# Patient Record
Sex: Female | Born: 1965 | Race: White | Hispanic: No | Marital: Single | Smoking: Current every day smoker
Health system: Southern US, Community
[De-identification: ages and names within clinical notes are randomized; demographics above are authoritative.]

## PROBLEM LIST (undated history)

## (undated) DIAGNOSIS — Z789 Other specified health status: Secondary | ICD-10-CM

---

## 2002-11-07 ENCOUNTER — Other Ambulatory Visit: Admission: RE | Admit: 2002-11-07 | Discharge: 2002-11-07 | Payer: Self-pay | Admitting: *Deleted

## 2003-04-26 ENCOUNTER — Inpatient Hospital Stay (HOSPITAL_COMMUNITY): Admission: AD | Admit: 2003-04-26 | Discharge: 2003-04-26 | Payer: Self-pay | Admitting: Obstetrics & Gynecology

## 2003-05-15 ENCOUNTER — Inpatient Hospital Stay (HOSPITAL_COMMUNITY): Admission: AD | Admit: 2003-05-15 | Discharge: 2003-05-18 | Payer: Self-pay | Admitting: Obstetrics and Gynecology

## 2003-06-28 ENCOUNTER — Other Ambulatory Visit: Admission: RE | Admit: 2003-06-28 | Discharge: 2003-06-28 | Payer: Self-pay | Admitting: *Deleted

## 2004-04-27 ENCOUNTER — Emergency Department (HOSPITAL_COMMUNITY): Admission: EM | Admit: 2004-04-27 | Discharge: 2004-04-27 | Payer: Self-pay | Admitting: Emergency Medicine

## 2004-04-30 ENCOUNTER — Encounter: Admission: RE | Admit: 2004-04-30 | Discharge: 2004-04-30 | Payer: Self-pay | Admitting: Family Medicine

## 2004-05-02 ENCOUNTER — Emergency Department (HOSPITAL_COMMUNITY): Admission: EM | Admit: 2004-05-02 | Discharge: 2004-05-02 | Payer: Self-pay | Admitting: Emergency Medicine

## 2004-12-13 ENCOUNTER — Emergency Department (HOSPITAL_COMMUNITY): Admission: EM | Admit: 2004-12-13 | Discharge: 2004-12-13 | Payer: Self-pay | Admitting: Emergency Medicine

## 2005-03-03 ENCOUNTER — Ambulatory Visit (HOSPITAL_COMMUNITY): Admission: RE | Admit: 2005-03-03 | Discharge: 2005-03-03 | Payer: Self-pay | Admitting: Sports Medicine

## 2006-05-25 HISTORY — PX: CARPAL TUNNEL RELEASE: SHX101

## 2006-05-25 HISTORY — PX: CERVICAL DISC SURGERY: SHX588

## 2006-05-25 HISTORY — PX: CERVICAL FUSION: SHX112

## 2006-07-09 ENCOUNTER — Ambulatory Visit (HOSPITAL_COMMUNITY): Admission: RE | Admit: 2006-07-09 | Discharge: 2006-07-09 | Payer: Self-pay | Admitting: Neurosurgery

## 2006-09-16 ENCOUNTER — Encounter: Admission: RE | Admit: 2006-09-16 | Discharge: 2006-09-16 | Payer: Self-pay | Admitting: Neurosurgery
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
## 2006-10-18 ENCOUNTER — Emergency Department (HOSPITAL_COMMUNITY): Admission: EM | Admit: 2006-10-18 | Discharge: 2006-10-18 | Payer: Self-pay | Admitting: Emergency Medicine

## 2008-01-19 ENCOUNTER — Emergency Department (HOSPITAL_COMMUNITY): Admission: EM | Admit: 2008-01-19 | Discharge: 2008-01-19 | Payer: Self-pay | Admitting: Family Medicine

## 2010-03-18 ENCOUNTER — Emergency Department (HOSPITAL_COMMUNITY): Admission: EM | Admit: 2010-03-18 | Discharge: 2010-03-18 | Payer: Self-pay | Admitting: Emergency Medicine

## 2010-10-07 NOTE — Op Note (Signed)
NAME:  Charlotte Green, Charlotte Green NO.:  [id number]   MEDICAL RECORD NO.:  [id number]          PATIENT TYPE:  EMS   LOCATION:  MAJO                         FACILITY:  [facility]   PHYSICIAN:  Dionne Ano. Gramig III, M.D.DATE OF BIRTH:  08/11/1965   DATE OF PROCEDURE:  10/18/2006  DATE OF DISCHARGE:  10/18/2006                               OPERATIVE REPORT   PREOPERATIVE DIAGNOSIS:  Large volar laceration, right small finger.   POSTOPERATIVE DIAGNOSIS:  Large volar laceration, right small finger  with open P3 fracture and ulnar digital nerve laceration.   SURGICAL PROCEDURES:  1. Irrigation and debridement skin, subcutaneous tissue, bone, and      tendon tissue.  2. Exploration flexor digitorum profundus tendon which was noted to be      intact.  3. Open repair ulnar digital nerve with fine nylon suture.   SURGEON:  Dionne Ano. Amanda Pea, M.D.   ASSISTANT:  None.   COMPLICATIONS:  None.   INDICATIONS FOR PROCEDURE:  This patient is a 45 year old female who  presents for the above-mentioned reconstruction.  I have counseled her  in regards to the significant surgery and desires to proceed.   DESCRIPTION OF PROCEDURE:  She is taken to the procedure suite and  underwent thorough prep and drape with Betadine scrub and paint.  Following this, the patient underwent I&D skin, subcutaneous tissue,  bone, and tendinous tissue.   This was an excisional debridement with copious amounts of saline.   Following this,  the patient then underwent identification of the flexor  apparatus on the volar floor.  The patient had thorough exploration and  following exploration, underwent active range of motion intraoperatively  which revealed an intact tendon.   Following this, the bone was simply treated with I&D.  It did not need  any setting or reduction.   Following this, I identified the ulnar digital nerve and repaired both  ends with fine tip and nylon suture.  The patient tolerated this  well  and no complicating features.  Once this done, I then closed the wound  with combination 6-0 chromic and 4-0 chromic.   The patient then underwent placement of sterile dressing.   She tolerated procedure well.  I discussed with her elevation, splinting  and no gripping, twisting, pushing, pulling.  Have her return to see [place]  in 7 days in the office.  She is discharged on vitamin C one p.o. b.i.d.  500 mg tablet.  In addition to this, I have recommended Peri-Colace,  Vicodin for pain, and Keflex 500 mg one p.o. q.i.d. for 10 days.   It has been a pleasure to see her and participate in her postoperative  care.           ______________________________  Dionne Ano. Everlene Other, M.D.     [facility]ash Mantis  D:  10/18/2006  T:  10/19/2006  Job:  [id number]

## 2010-10-07 NOTE — H&P (Signed)
NAME:  Charlotte Green, ANTU NO.:  [id number]   MEDICAL RECORD NO.:  [id number]          PATIENT TYPE:  EMS   LOCATION:  MAJO                         FACILITY:  [facility]   PHYSICIAN:  Dionne Ano. Gramig III, M.D.DATE OF BIRTH:  [birth year]   DATE OF ADMISSION:  10/18/2006  DATE OF DISCHARGE:  10/18/2006                              HISTORY & PHYSICAL   I was asked to see the patient in the [facility] Emergency Room.  This  patient is a 45 year old, right-hand dominant female who sustained a  laceration to her right small finger as she was trying to cut up a  tomato.  She has a volar laceration, quite deep.  She complains of pain.  Tetanus status is updated.  The patient has no other injuries at the  present time.   PAST MEDICAL HISTORY:  None.   PAST SURGICAL HISTORY:  Carpal tunnel release and neck surgery by Dr.  Jordan Likes.   ALLERGIES:  CODEINE.   MEDICATIONS:  None.   SOCIAL HISTORY:  The patient's smokes less than a pack per day.  She  rarely enjoys an alcoholic beverage.  She works as a Hospital doctor.   PHYSICAL EXAMINATION:  GENERAL:  Alert and oriented x3, in no acute  distress.  VITAL SIGNS:  Stable.  EXTREMITIES:  The patient has full function about the index, middle, and  ring finger.  Her small finger has a very significant laceration,  triangular in shape, based distally unfortunately.  This goes down to  the flexor tendon apparatus.  I have reviewed this at length.  The  remaining exam is difficult due to her pain.  She does have some  numbness about the ulnar aspect of the pulp.  The opposite extremity is  neurovascularly intact.  Normal MCP range of motion.   X-rays were reviewed which showed a small chip fracture about the distal  phalanx.   IMPRESSION:  Open P3 fracture with significant disarray and ulnar  digital nerve laceration.   PLAN:  Verbally consented for I&D and repair of structures as necessary.     ______________________________  Dionne Ano. Everlene Other, M.D.     [facility]  D:  10/18/2006  T:  10/19/2006  Job:  [id number]   cc:   Markham Jordan L. Effie Shy, M.D.

## 2010-10-10 NOTE — Op Note (Signed)
NAME:  Charlotte Green, Charlotte Green                ACCOUNT NO.:  [id number]   MEDICAL RECORD NO.:  [id number]          PATIENT TYPE:  AMB   LOCATION:  SDS                          FACILITY:  [facility]   PHYSICIAN:  Henry A. Pool, M.D.    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   PROCEDURE NOTE:  C3-4 anterior cervical diskectomy with fusion allograft  and anterior plating.   SURGEON:  Kathaleen Maser. Pool, M.D.   ASSISTANT:  Donalee Citrin, M.D.   ANESTHESIA:  General.   INDICATIONS:  Charlotte Green is a 45 year old female with a history of chronic  severe neck pain with radiation to her left shoulder failing all  conservative management.  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  The patient has a leftward C3-4 disk herniation  associated with spondylosis.  She has failed all efforts at conservative  management.  She presents now for C3-4 anterior cervical diskectomy and  fusion with allograft and anterior plating in hopes of improving her  symptoms.  The patient has a coexistent left-sided carpal tunnel  syndrome, which will be addressed with a carpal tunnel release.  This be  dictated in a separate note.   OPERATIVE NOTE:  The patient was taken to the operating room and placed  on the operating table in the supine position.  After an adequate level  of anesthesia was achieved, the patient was positioned supine with the  neck slightly extended and held in place with halter traction.  The  patient's anterior cervical region was prepped and draped sterilely.  A  10 blade was used to make a linear skin incision overlying the C3-4  interspace.  This was carried down sharply to the  platysma.  The  platysma was then divided vertically and dissection along the medial  border of the sternomastoid muscle and carotid sheath.  Trachea and  esophagus were mobilized and retracted towards the left.  The  prevertebral fascia was stripped off the anterior spinal column.  The  longus colli muscle was elevated bilaterally using electrocautery.  Deep  self-retaining retractor was placed.  Intraoperative fluoroscopy was  used.  The C3-4 level was confirmed.  The disk space incised with 15  blade in a rectangular fashion.  A wide disk space clean-out was then  achieved using pituitary rongeurs, forward and backward-angled Karlin  curettes, Kerrison rongeurs, high-speed drill.  All elements of  the  disk were removed down to the level of the posterior annulus.  A  microscope was then brought onto the field and used throughout the  remainder of the diskectomy.  The remaining aspects of the annulus and  osteophytes were removed using the high-speed drill down to the level of  the posterior longitudinal ligament.  The posterior longitudinal  ligament was then elevated and  resected in piecemeal fashion using  Kerrison rongeurs.  The underlying thecal sac was identified.  A wide  central decompression then performed by undercutting the bodies of C3  and C4 using Kerrison rongeurs.  Anterior foraminotomy was then  performed along the course of the exiting C4 nerve roots bilaterally.  At this point a very thorough decompression had been achieved.  There  was no evidence of injury to the thecal sac or nerve roots.  The wound  was then irrigated with antibiotic solution.  Gelfoam was placed  topically for hemostasis.  A 5 mm LifeNet allograft wedge was then  packed into place and recessed proximal 1 mL from the anterior cortical  margin.  A 23 mL Atlantis anterior cervical plate was then placed over  the C3 and C4 bodies.  This was then attached under fluoroscopic  guidance using 13 mm  variable-angled screws, two each at both levels.  All screws were given final tightening and found to be solidly within  bone.  The locking screws were engaged at both levels.  Final images  revealed good position of bone grafts and hardware, proper operative  level, and normal alignment of the spine.  The wound was then inspected  for hemostasis, found to be good.  The wound was then closed in the  typical fashion.  Steri-Strips and sterile dressing were applied.  The  patient tolerates the procedure and she remains in the recovery room  awaiting her carpal tunnel release.           ______________________________  Kathaleen Maser. Pool, M.D.     HAP/MEDQ  D:  07/09/2006  T:  07/09/2006  Job:  [id number]

## 2010-10-10 NOTE — Op Note (Signed)
NAME:  Charlotte Green, PROBUS                ACCOUNT NO.:  [id number]   MEDICAL RECORD NO.:  [id number]          PATIENT TYPE:  AMB   LOCATION:  SDS                          FACILITY:  [facility]   PHYSICIAN:  Henry A. Pool, M.D.    DATE OF BIRTH:  10-20-1965   DATE OF PROCEDURE:  07/09/2006  DATE OF DISCHARGE:                               OPERATIVE REPORT   PREOPERATIVE DIAGNOSIS:  Left carpal tunnel syndrome.   POSTOPERATIVE DIAGNOSIS:  Left carpal tunnel syndrome.   PROCEDURE NOTE:  Left carpal tunnel release.   SURGEON:  Kathaleen Maser. Pool, M.D.   ASSISTANT:  None.   ANESTHESIA:  General orotracheal anesthesia.   INDICATIONS FOR PROCEDURE:  Ms. 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  She has failed conservative  management.  She is recently status post C3-4 anterior cervical  diskectomy and fusion and remains under general anesthesia from this  operation.  She is now here to undergo left-sided carpal tunnel release.   The patient's left upper extremity was prepped and draped sterilely.  A  15 blade was used make a linear incision along the mid palmar line just  distal to the distal wrist crease.  This carried down sharply to the  palmar fascia down to the level of the transverse carpal ligament.  Transverse carpal ligament then split using a 15 blade exposing the  underlying median nerve.  Median nerve was then protected using a Market researcher.  The transverse carpal ligament was then divided distally and  fully releasing the distal aspect of the nerve.  The Glorious Peach was then  placed proximally.  The transverse carpal ligament then divided into the  wrist and forearm fully decompressing the median nerve.  There is no  injury to the nerve itself.  There is no residual compression.  Wound is  then irrigated and then closed with a flow Vicryl suture in inverted  deep dermal layer and 4-0  nylon suture in an interrupted vertical  mattress fashion.  There were no apparent complications.  The patient  tolerated the procedure well and she returns to the recovery room  postoperatively.           ______________________________  Kathaleen Maser Pool, M.D.     HAP/MEDQ  D:  07/09/2006  T:  07/09/2006  Job:  [id number]

## 2010-11-07 ENCOUNTER — Emergency Department (HOSPITAL_COMMUNITY)
Admission: EM | Admit: 2010-11-07 | Discharge: 2010-11-07 | Disposition: A | Discharge status: Home / Self Care | Payer: BC Managed Care – PPO | Attending: Emergency Medicine | Admitting: Emergency Medicine

## 2010-11-07 ENCOUNTER — Emergency Department (HOSPITAL_COMMUNITY): Payer: BC Managed Care – PPO

## 2010-11-07 DIAGNOSIS — R079 Chest pain, unspecified: Secondary | ICD-10-CM | POA: Insufficient documentation

## 2010-11-07 DIAGNOSIS — M940 Chondrocostal junction syndrome [Tietze]: Secondary | ICD-10-CM | POA: Insufficient documentation

## 2010-11-07 DIAGNOSIS — H6690 Otitis media, unspecified, unspecified ear: Secondary | ICD-10-CM | POA: Insufficient documentation

## 2010-11-07 LAB — BASIC METABOLIC PANEL
BUN: 9 mg/dL (ref 6–23)
CO2: 24 mEq/L (ref 19–32)
Calcium: 9.4 mg/dL (ref 8.4–10.5)
Chloride: 106 mEq/L (ref 96–112)
Creatinine, Ser: 0.75 mg/dL (ref 0.50–1.10)
GFR calc Af Amer: >60 mL/min (ref >60)
GFR calc non Af Amer: >60 mL/min (ref >60)
Glucose, Bld: 94 mg/dL (ref 70–99)
Potassium: 4 mEq/L (ref 3.5–5.1)
Sodium: 138 mEq/L (ref 135–145)

## 2010-11-07 LAB — CBC
HCT: 39 % (ref 36.0–46.0)
Hemoglobin: 12.7 g/dL (ref 12.0–15.0)
MCH: 29.5 pg (ref 26.0–34.0)
MCHC: 32.6 g/dL (ref 30.0–36.0)
MCV: 90.5 fL (ref 78.0–100.0)
Platelets: 172 10*3/uL (ref 150–400)
RBC: 4.31 MIL/uL (ref 3.87–5.11)
RDW: 12.4 % (ref 11.5–15.5)
WBC: 8.7 10*3/uL (ref 4.0–10.5)

## 2010-11-07 LAB — DIFFERENTIAL
Basophils Absolute: 0.1 10*3/uL (ref 0.0–0.1)
Basophils Relative: 1 % (ref 0–1)
Eosinophils Absolute: 0.5 10*3/uL (ref 0.0–0.7)
Eosinophils Relative: 5 % (ref 0–5)
Lymphocytes Relative: 26 % (ref 12–46)
Lymphs Abs: 2.2 10*3/uL (ref 0.7–4.0)
Monocytes Absolute: 0.5 10*3/uL (ref 0.1–1.0)
Monocytes Relative: 6 % (ref 3–12)
Neutro Abs: 5.4 10*3/uL (ref 1.7–7.7)
Neutrophils Relative %: 63 % (ref 43–77)

## 2011-06-03 ENCOUNTER — Encounter (HOSPITAL_BASED_OUTPATIENT_CLINIC_OR_DEPARTMENT_OTHER): Payer: Self-pay | Admitting: *Deleted

## 2011-06-03 NOTE — Progress Notes (Signed)
To arrive with boyfriend No meds

## 2011-06-05 ENCOUNTER — Ambulatory Visit (HOSPITAL_BASED_OUTPATIENT_CLINIC_OR_DEPARTMENT_OTHER): Payer: BC Managed Care – PPO | Admitting: Anesthesiology

## 2011-06-05 ENCOUNTER — Encounter (HOSPITAL_BASED_OUTPATIENT_CLINIC_OR_DEPARTMENT_OTHER): Payer: Self-pay | Admitting: Anesthesiology

## 2011-06-05 ENCOUNTER — Ambulatory Visit (HOSPITAL_BASED_OUTPATIENT_CLINIC_OR_DEPARTMENT_OTHER)
Admission: RE | Admit: 2011-06-05 | Discharge: 2011-06-05 | Disposition: A | Discharge status: Home / Self Care | Payer: BC Managed Care – PPO | Source: Ambulatory Visit | Attending: Otolaryngology | Admitting: Otolaryngology

## 2011-06-05 ENCOUNTER — Encounter (HOSPITAL_BASED_OUTPATIENT_CLINIC_OR_DEPARTMENT_OTHER)
Admission: RE | Disposition: A | Discharge status: Home / Self Care | Payer: Self-pay | Source: Ambulatory Visit | Attending: Otolaryngology

## 2011-06-05 ENCOUNTER — Encounter (HOSPITAL_BASED_OUTPATIENT_CLINIC_OR_DEPARTMENT_OTHER): Payer: Self-pay | Admitting: *Deleted

## 2011-06-05 DIAGNOSIS — H7290 Unspecified perforation of tympanic membrane, unspecified ear: Secondary | ICD-10-CM | POA: Insufficient documentation

## 2011-06-05 DIAGNOSIS — H902 Conductive hearing loss, unspecified: Secondary | ICD-10-CM | POA: Insufficient documentation

## 2011-06-05 DIAGNOSIS — H7420 Discontinuity and dislocation of ear ossicles, unspecified ear: Secondary | ICD-10-CM | POA: Insufficient documentation

## 2011-06-05 HISTORY — PX: TYMPANOPLASTY: SHX33

## 2011-06-05 HISTORY — DX: Other specified health status: Z78.9

## 2011-06-05 LAB — POCT HEMOGLOBIN-HEMACUE: Hemoglobin: 13.2 g/dL (ref 12.0–15.0)

## 2011-06-05 SURGERY — TYMPANOPLASTY
Anesthesia: General | Site: Ear | Laterality: Left | Wound class: Clean Contaminated

## 2011-06-05 MED ORDER — LACTATED RINGERS IV SOLN
INTRAVENOUS | Status: DC
  Administered 2011-06-05 (×2): via INTRAVENOUS

## 2011-06-05 MED ORDER — PROPOFOL 10 MG/ML IV EMUL
INTRAVENOUS | Status: DC | PRN
  Administered 2011-06-05: 200 mg via INTRAVENOUS

## 2011-06-05 MED ORDER — MIDAZOLAM HCL 2 MG/2ML IJ SOLN: 0.5000 mg | INTRAMUSCULAR | Status: DC | PRN

## 2011-06-05 MED ORDER — SCOPOLAMINE 1 MG/3DAYS TD PT72
1.0000 | MEDICATED_PATCH | Freq: Once | TRANSDERMAL | Status: DC
  Administered 2011-06-05: 1.5 mg via TRANSDERMAL

## 2011-06-05 MED ORDER — HYDROCODONE-ACETAMINOPHEN 5-500 MG PO TABS: 1.0000 | ORAL_TABLET | ORAL | Status: AC | PRN

## 2011-06-05 MED ORDER — LIDOCAINE HCL (CARDIAC) 20 MG/ML IV SOLN
INTRAVENOUS | Status: DC | PRN
  Administered 2011-06-05: 100 mg via INTRAVENOUS

## 2011-06-05 MED ORDER — MIDAZOLAM HCL 5 MG/5ML IJ SOLN
INTRAMUSCULAR | Status: DC | PRN
  Administered 2011-06-05: 2 mg via INTRAVENOUS

## 2011-06-05 MED ORDER — DEXAMETHASONE SODIUM PHOSPHATE 4 MG/ML IJ SOLN
INTRAMUSCULAR | Status: DC | PRN
  Administered 2011-06-05: 10 mg via INTRAVENOUS

## 2011-06-05 MED ORDER — EPINEPHRINE HCL 1 MG/ML IJ SOLN
INTRAMUSCULAR | Status: DC | PRN
  Administered 2011-06-05: 1 mL

## 2011-06-05 MED ORDER — BACITRACIN ZINC 500 UNIT/GM EX OINT
TOPICAL_OINTMENT | CUTANEOUS | Status: DC | PRN
  Administered 2011-06-05: 1 via TOPICAL

## 2011-06-05 MED ORDER — CEFAZOLIN SODIUM 1-5 GM-% IV SOLN
INTRAVENOUS | Status: DC | PRN
  Administered 2011-06-05: 1 g via INTRAVENOUS

## 2011-06-05 MED ORDER — FENTANYL CITRATE 0.05 MG/ML IJ SOLN
25.0000 ug | INTRAMUSCULAR | Status: DC | PRN
  Administered 2011-06-05 (×2): 50 ug via INTRAVENOUS

## 2011-06-05 MED ORDER — SUCCINYLCHOLINE CHLORIDE 20 MG/ML IJ SOLN
INTRAMUSCULAR | Status: DC | PRN
  Administered 2011-06-05: 100 mg via INTRAVENOUS

## 2011-06-05 MED ORDER — CIPROFLOXACIN-DEXAMETHASONE 0.3-0.1 % OT SUSP
OTIC | Status: DC | PRN
  Administered 2011-06-05: 10 [drp] via OTIC

## 2011-06-05 MED ORDER — CEPHALEXIN 500 MG PO CAPS: 500.0000 mg | ORAL_CAPSULE | Freq: Two times a day (BID) | ORAL | Status: AC

## 2011-06-05 MED ORDER — FENTANYL CITRATE 0.05 MG/ML IJ SOLN
INTRAMUSCULAR | Status: DC | PRN
  Administered 2011-06-05 (×2): 50 ug via INTRAVENOUS

## 2011-06-05 MED ORDER — METOCLOPRAMIDE HCL 5 MG/ML IJ SOLN: 10.0000 mg | Freq: Once | INTRAMUSCULAR | Status: DC | PRN

## 2011-06-05 MED ORDER — ONDANSETRON HCL 4 MG/2ML IJ SOLN
INTRAMUSCULAR | Status: DC | PRN
  Administered 2011-06-05 (×2): 4 mg via INTRAVENOUS

## 2011-06-05 MED ORDER — ACETAMINOPHEN 10 MG/ML IV SOLN
1000.0000 mg | Freq: Once | INTRAVENOUS | Status: AC
  Administered 2011-06-05: 1000 mg via INTRAVENOUS

## 2011-06-05 MED ORDER — LIDOCAINE-EPINEPHRINE 1 %-1:100000 IJ SOLN
INTRAMUSCULAR | Status: DC | PRN
  Administered 2011-06-05: 5.5 mL

## 2011-06-05 MED ORDER — MORPHINE SULFATE 2 MG/ML IJ SOLN: 0.0500 mg/kg | INTRAMUSCULAR | Status: DC | PRN

## 2011-06-05 MED ORDER — HYDROCODONE-ACETAMINOPHEN 5-325 MG PO TABS
1.0000 | ORAL_TABLET | Freq: Once | ORAL | Status: AC
  Administered 2011-06-05: 1 via ORAL

## 2011-06-05 MED ORDER — METHYLENE BLUE 1 % INJ SOLN
INTRAMUSCULAR | Status: DC | PRN
  Administered 2011-06-05: .5 mL

## 2011-06-05 SURGICAL SUPPLY — 72 items
BENZOIN TINCTURE PRP APPL 2/3 (GAUZE/BANDAGES/DRESSINGS) ×2 IMPLANT
BIT DRILL LEGEND 0.5MM 70MM (BIT) IMPLANT
BIT DRILL LEGEND 1.0MM 70MM (BIT) IMPLANT
BIT DRILL LEGEND 4.0MM 70MM (BIT) IMPLANT
BLADE EAR TYMPAN 2.5 60D BEAV (BLADE) ×2 IMPLANT
BLADE EYE SICKLE 84 5 BEAV (BLADE) IMPLANT
BLADE NEEDLE 3 SS STRL (BLADE) IMPLANT
BLADE SURG ROTATE 9660 (MISCELLANEOUS) IMPLANT
CANISTER SUCTION 1200CC (MISCELLANEOUS) ×2 IMPLANT
CLOTH BEACON ORANGE TIMEOUT ST (SAFETY) ×2 IMPLANT
COTTONBALL LRG STERILE PKG (GAUZE/BANDAGES/DRESSINGS) ×2 IMPLANT
DECANTER SPIKE VIAL GLASS SM (MISCELLANEOUS) ×2 IMPLANT
DEPRESSOR TONGUE BLADE STERILE (MISCELLANEOUS) ×4 IMPLANT
DERMABOND ADVANCED (GAUZE/BANDAGES/DRESSINGS)
DERMABOND ADVANCED .7 DNX12 (GAUZE/BANDAGES/DRESSINGS) IMPLANT
DRAPE EENT ADH APERT 31X51 STR (DRAPES) ×2 IMPLANT
DRAPE MICROSCOPE URBAN (DRAPES) ×2 IMPLANT
DRAPE SURG 17X23 STRL (DRAPES) IMPLANT
DRESSING ADAPTIC 1/2  N-ADH (PACKING) IMPLANT
DRILL BIT LEGEND (BIT) IMPLANT
DRILL BIT LEGEND 7BA20-MN (BIT) IMPLANT
DRILL BIT LEGEND 7BA25-MN (BIT) IMPLANT
DRILL BIT LEGEND 7BA30-MN (BIT) IMPLANT
DRILL BIT LEGEND 7BA30D-MN (BIT) IMPLANT
DRILL BIT LEGEND 7BA30DL-MN (BIT) IMPLANT
DRILL BIT LEGEND 7BA30L-MN (BIT) IMPLANT
DRILL BIT LEGEND 7BA40-MN (BIT) IMPLANT
DRILL BIT LEGEND 7BA40D-MN (BIT) IMPLANT
DRILL BIT LEGEND 7BA50-MN (BIT) IMPLANT
DRILL BIT LEGEND 7BA50D-MN (BIT) IMPLANT
DRILL BIT LEGEND 7BA60-MN (BIT) IMPLANT
DRILL BIT LEGEND 7BA70-MN (BIT) IMPLANT
DRSG GLASSCOCK MASTOID ADT (GAUZE/BANDAGES/DRESSINGS) ×2 IMPLANT
DRSG GLASSCOCK MASTOID PED (GAUZE/BANDAGES/DRESSINGS) IMPLANT
ELECT COATED BLADE 2.86 ST (ELECTRODE) ×2 IMPLANT
ELECT REM PT RETURN 9FT ADLT (ELECTROSURGICAL) ×2
ELECTRODE REM PT RTRN 9FT ADLT (ELECTROSURGICAL) ×1 IMPLANT
GAUZE SPONGE 4X4 12PLY STRL LF (GAUZE/BANDAGES/DRESSINGS) IMPLANT
GLOVE SKINSENSE NS SZ7.0 (GLOVE) ×3
GLOVE SKINSENSE STRL SZ7.0 (GLOVE) ×3 IMPLANT
GLOVE SS BIOGEL STRL SZ 7.5 (GLOVE) ×1 IMPLANT
GLOVE SUPERSENSE BIOGEL SZ 7.5 (GLOVE) ×1
GOWN PREVENTION PLUS XLARGE (GOWN DISPOSABLE) ×2 IMPLANT
GOWN PREVENTION PLUS XXLARGE (GOWN DISPOSABLE) ×2 IMPLANT
IV CATH AUTO 14GX1.75 SAFE ORG (IV SOLUTION) IMPLANT
IV NS 500ML (IV SOLUTION)
IV NS 500ML BAXH (IV SOLUTION) IMPLANT
NDL SAFETY ECLIPSE 18X1.5 (NEEDLE) IMPLANT
NEEDLE 27GAX1X1/2 (NEEDLE) ×2 IMPLANT
NEEDLE HYPO 18GX1.5 SHARP (NEEDLE)
NS IRRIG 1000ML POUR BTL (IV SOLUTION) ×2 IMPLANT
PACK BASIN DAY SURGERY FS (CUSTOM PROCEDURE TRAY) ×2 IMPLANT
PACK ENT DAY SURGERY (CUSTOM PROCEDURE TRAY) ×2 IMPLANT
PENCIL BUTTON HOLSTER BLD 10FT (ELECTRODE) ×2 IMPLANT
SET EXT MALE ROTATING LL 32IN (MISCELLANEOUS) ×2 IMPLANT
SHEET MEDIUM DRAPE 40X70 STRL (DRAPES) IMPLANT
SLEEVE SCD COMPRESS KNEE MED (MISCELLANEOUS) ×2 IMPLANT
SPONGE GAUZE 4X4 12PLY (GAUZE/BANDAGES/DRESSINGS) IMPLANT
SPONGE SURGIFOAM ABS GEL 12-7 (HEMOSTASIS) ×4 IMPLANT
STRIP CLOSURE SKIN 1/2X4 (GAUZE/BANDAGES/DRESSINGS) IMPLANT
SUT CHROMIC 2 0 SH (SUTURE) IMPLANT
SUT CHROMIC 3 0 PS 2 (SUTURE) ×2 IMPLANT
SUT ETHILON 4 0 P 3 18 (SUTURE) IMPLANT
SUT ETHILON 5 0 P 3 18 (SUTURE) ×1
SUT NYLON ETHILON 5-0 P-3 1X18 (SUTURE) ×1 IMPLANT
SUT PLAIN 5 0 P 3 18 (SUTURE) ×2 IMPLANT
SYR 3ML 18GX1 1/2 (SYRINGE) IMPLANT
SYR TB 1ML LL NO SAFETY (SYRINGE) IMPLANT
TOWEL OR 17X24 6PK STRL BLUE (TOWEL DISPOSABLE) ×2 IMPLANT
TRAY DSU PREP LF (CUSTOM PROCEDURE TRAY) ×2 IMPLANT
TUBING IRRIGATION STER IRD100 (TUBING) IMPLANT
WATER STERILE IRR 1000ML POUR (IV SOLUTION) IMPLANT

## 2011-06-05 NOTE — Brief Op Note (Signed)
06/05/2011  11:25 AM  PATIENT:  Charlotte Green  46 y.o. female  PRE-OPERATIVE DIAGNOSIS:  tm perforation  POST-OPERATIVE DIAGNOSIS:  Left Tympanic Membrane Perforatio  w/ Hearing Loss  PROCEDURE:  Procedure(s): TYMPANOPLASTY with Cartilage Graft Ossiculoplasty  SURGEON:  Surgeon(s): Drema Halon, MD  PHYSICIAN ASSISTANT:   ASSISTANTS: none   ANESTHESIA:   general  EBL:  Total I/O In: 1800 [I.V.:1800] Out: -   BLOOD ADMINISTERED:none  DRAINS: none   LOCAL MEDICATIONS USED:  XYLOCAINE  With epi 10CC  SPECIMEN:  No Specimen  DISPOSITION OF SPECIMEN:  N/A  COUNTS:  YES  TOURNIQUET:  * No tourniquets in log *  DICTATION: .Other Dictation: Dictation Number [id number]  PLAN OF CARE: Discharge to home after PACU  PATIENT DISPOSITION:  PACU - hemodynamically stable.   Delay start of Pharmacological VTE agent (>24hrs) due to surgical blood loss or risk of bleeding:  {YES/NO/NOT APPLICABLE:20182

## 2011-06-05 NOTE — Transfer of Care (Signed)
Immediate Anesthesia Transfer of Care Note  Patient: Charlotte Green  Procedure(s) Performed:  TYMPANOPLASTY - with ossicular reconstruction w/ Cartilage Graft  Patient Location: PACU  Anesthesia Type: General  Level of Consciousness: awake, alert  and oriented  Airway & Oxygen Therapy: Patient Spontanous Breathing and Patient connected to nasal cannula oxygen  Post-op Assessment: Report given to PACU RN and Post -op Vital signs reviewed and stable  Post vital signs: Reviewed and stable Filed Vitals:   06/05/11 0623  BP: 105/74  Pulse: 71  Temp: 36.9 C  Resp: 16    Complications: No apparent anesthesia complications

## 2011-06-05 NOTE — Anesthesia Postprocedure Evaluation (Signed)
Anesthesia Post Note  Patient: Charlotte Green  Procedure(s) Performed:  TYMPANOPLASTY - with ossicular reconstruction w/ Cartilage Graft  Anesthesia type: General  Patient location: PACU  Post pain: Pain level controlled  Post assessment: Patient's Cardiovascular Status Stable  Last Vitals:  Filed Vitals:   06/05/11 1255  BP: 119/76  Pulse: 85  Temp: 36.9 C  Resp: 16    Post vital signs: Reviewed and stable  Level of consciousness: alert  Complications: No apparent anesthesia complications

## 2011-06-05 NOTE — Anesthesia Preprocedure Evaluation (Signed)
Anesthesia Evaluation  Patient identified by MRN, date of birth, ID band Patient awake    Reviewed: Allergy & Precautions, H&P , NPO status , Patient's Chart, lab work & pertinent test results, reviewed documented beta blocker date and time   Airway Mallampati: II TM Distance: >3 FB Neck ROM: full    Dental   Pulmonary neg pulmonary ROS,          Cardiovascular neg cardio ROS     Neuro/Psych Negative Neurological ROS  Negative Psych ROS   GI/Hepatic negative GI ROS, Neg liver ROS,   Endo/Other  Negative Endocrine ROS  Renal/GU negative Renal ROS  Genitourinary negative   Musculoskeletal   Abdominal   Peds  Hematology negative hematology ROS (+)   Anesthesia Other Findings See surgeon's H&P   Reproductive/Obstetrics negative OB ROS                           Anesthesia Physical Anesthesia Plan  ASA: II  Anesthesia Plan: General   Post-op Pain Management:    Induction: Intravenous  Airway Management Planned: Oral ETT  Additional Equipment:   Intra-op Plan:   Post-operative Plan: Extubation in OR  Informed Consent: I have reviewed the patients History and Physical, chart, labs and discussed the procedure including the risks, benefits and alternatives for the proposed anesthesia with the patient or authorized representative who has indicated his/her understanding and acceptance.     Plan Discussed with: CRNA and Surgeon  Anesthesia Plan Comments:         Anesthesia Quick Evaluation  

## 2011-06-05 NOTE — Anesthesia Procedure Notes (Signed)
Procedure Name: Intubation Date/Time: 06/05/2011 8:17 AM Performed by: Zenia Resides D Pre-anesthesia Checklist: Patient identified, Emergency Drugs available, Suction available and Patient being monitored Patient Re-evaluated:Patient Re-evaluated prior to inductionOxygen Delivery Method: Circle System Utilized Preoxygenation: Pre-oxygenation with 100% oxygen Intubation Type: IV induction Ventilation: Mask ventilation without difficulty Laryngoscope Size: Mac and 3 Grade View: Grade I Tube type: Oral Tube size: 7.0 mm Number of attempts: 1 Airway Equipment and Method: stylet and oral airway Placement Confirmation: ETT inserted through vocal cords under direct vision,  positive ETCO2 and breath sounds checked- equal and bilateral Secured at: 22 cm Tube secured with: Tape Dental Injury: Teeth and Oropharynx as per pre-operative assessment
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
## 2011-06-08 ENCOUNTER — Encounter (HOSPITAL_BASED_OUTPATIENT_CLINIC_OR_DEPARTMENT_OTHER): Payer: Self-pay | Admitting: Otolaryngology

## 2012-04-07 ENCOUNTER — Emergency Department (HOSPITAL_COMMUNITY): Payer: BC Managed Care – PPO

## 2012-04-07 ENCOUNTER — Emergency Department (HOSPITAL_COMMUNITY)
Admission: EM | Admit: 2012-04-07 | Discharge: 2012-04-07 | Disposition: A | Discharge status: Home / Self Care | Payer: BC Managed Care – PPO | Attending: Emergency Medicine | Admitting: Emergency Medicine

## 2012-04-07 DIAGNOSIS — Y9389 Activity, other specified: Secondary | ICD-10-CM | POA: Insufficient documentation

## 2012-04-07 DIAGNOSIS — Y929 Unspecified place or not applicable: Secondary | ICD-10-CM | POA: Insufficient documentation

## 2012-04-07 DIAGNOSIS — S161XXA Strain of muscle, fascia and tendon at neck level, initial encounter: Secondary | ICD-10-CM

## 2012-04-07 DIAGNOSIS — S139XXA Sprain of joints and ligaments of unspecified parts of neck, initial encounter: Secondary | ICD-10-CM | POA: Insufficient documentation

## 2012-04-07 DIAGNOSIS — F17200 Nicotine dependence, unspecified, uncomplicated: Secondary | ICD-10-CM | POA: Insufficient documentation

## 2012-04-07 MED ORDER — TRAMADOL HCL 50 MG PO TABS: 50.0000 mg | ORAL_TABLET | Freq: Four times a day (QID) | ORAL | Status: DC | PRN

## 2012-04-07 MED ORDER — IBUPROFEN 800 MG PO TABS
800.0000 mg | ORAL_TABLET | Freq: Once | ORAL | Status: AC
Start: 2012-04-07 — End: 2012-04-07
  Administered 2012-04-07: 800 mg via ORAL
  Filled 2012-04-07: qty 1

## 2012-04-07 NOTE — ED Notes (Addendum)
Dr. Rubin Payor currenlty assessing  pt.

## 2012-04-07 NOTE — ED Notes (Signed)
Patient transported to X-ray 
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
## 2013-08-22 ENCOUNTER — Encounter (HOSPITAL_COMMUNITY): Payer: Self-pay | Admitting: Emergency Medicine

## 2013-08-22 ENCOUNTER — Emergency Department (HOSPITAL_COMMUNITY)
Admission: EM | Admit: 2013-08-22 | Discharge: 2013-08-22 | Disposition: A | Discharge status: Home / Self Care | Payer: BC Managed Care – PPO | Attending: Emergency Medicine | Admitting: Emergency Medicine

## 2013-08-22 DIAGNOSIS — R42 Dizziness and giddiness: Secondary | ICD-10-CM | POA: Insufficient documentation

## 2013-08-22 DIAGNOSIS — H9209 Otalgia, unspecified ear: Secondary | ICD-10-CM | POA: Insufficient documentation

## 2013-08-22 DIAGNOSIS — J069 Acute upper respiratory infection, unspecified: Secondary | ICD-10-CM | POA: Insufficient documentation

## 2013-08-22 DIAGNOSIS — F17200 Nicotine dependence, unspecified, uncomplicated: Secondary | ICD-10-CM | POA: Insufficient documentation

## 2013-08-22 DIAGNOSIS — R52 Pain, unspecified: Secondary | ICD-10-CM | POA: Insufficient documentation

## 2013-08-22 DIAGNOSIS — B9789 Other viral agents as the cause of diseases classified elsewhere: Secondary | ICD-10-CM

## 2013-08-22 DIAGNOSIS — J988 Other specified respiratory disorders: Secondary | ICD-10-CM

## 2013-08-22 MED ORDER — HYDROCOD POLST-CHLORPHEN POLST 10-8 MG/5ML PO LQCR
5.0000 mL | Freq: Two times a day (BID) | ORAL | Status: DC | PRN
Start: 2013-08-22 — End: 2020-09-23

## 2013-08-22 MED ORDER — AMOXICILLIN 500 MG PO CAPS: 500.0000 mg | ORAL_CAPSULE | Freq: Three times a day (TID) | ORAL | Status: DC

## 2013-08-22 NOTE — Discharge Instructions (Signed)
Take ibuprofen w/ food up to three times a day, as needed for pain, fever and chills.  Take tussionex at night as needed for cough.  Do not drive within four hours of taking this medication (may cause drowsiness or confusion).   Take sudafed as needed for nasal/sinus congestion.  Get rest, drink plenty of fluids and wash hands often to prevent spread of infection.  You should return to the ER if your cough worsens, you develop difficulty breathing or pain in your chest or symptoms have not started to improve in 5-7days.

## 2013-08-22 NOTE — ED Notes (Signed)
Pt reports cold symptoms (sneezing, coughing, congestion with sore throat) for a week and is now having ear pain with pus and blood like drainage. Pt reports headaches and with some dizziness. Pt alert and orient and ambulatory to triage. Pt also reports heaviness to both legs.

## 2013-08-22 NOTE — ED Provider Notes (Signed)
Medical screening examination/treatment/procedure(s) were performed by non-physician practitioner and as supervising physician I was immediately available for consultation/collaboration.  Halena Mohar L Amiree No, MD 08/22/13 2313 
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
## 2013-12-31 ENCOUNTER — Emergency Department (INDEPENDENT_AMBULATORY_CARE_PROVIDER_SITE_OTHER): Payer: BC Managed Care – PPO

## 2013-12-31 ENCOUNTER — Emergency Department (INDEPENDENT_AMBULATORY_CARE_PROVIDER_SITE_OTHER)
Admission: EM | Admit: 2013-12-31 | Discharge: 2013-12-31 | Disposition: A | Discharge status: Home / Self Care | Payer: BC Managed Care – PPO | Source: Home / Self Care | Attending: Emergency Medicine | Admitting: Emergency Medicine

## 2013-12-31 ENCOUNTER — Encounter (HOSPITAL_COMMUNITY): Payer: Self-pay | Admitting: Emergency Medicine

## 2013-12-31 DIAGNOSIS — K2970 Gastritis, unspecified, without bleeding: Secondary | ICD-10-CM

## 2013-12-31 DIAGNOSIS — J189 Pneumonia, unspecified organism: Secondary | ICD-10-CM

## 2013-12-31 MED ORDER — BENZONATATE 200 MG PO CAPS: 200.0000 mg | ORAL_CAPSULE | Freq: Three times a day (TID) | ORAL | Status: DC | PRN

## 2013-12-31 MED ORDER — GI COCKTAIL ~~LOC~~
30.0000 mL | Freq: Once | ORAL | Status: AC
  Administered 2013-12-31: 30 mL via ORAL

## 2013-12-31 MED ORDER — CEFDINIR 300 MG PO CAPS: 300.0000 mg | ORAL_CAPSULE | Freq: Two times a day (BID) | ORAL | Status: DC

## 2013-12-31 MED ORDER — GI COCKTAIL ~~LOC~~
ORAL | Status: AC
  Filled 2013-12-31: qty 30

## 2013-12-31 MED ORDER — CEFTRIAXONE SODIUM 1 G IJ SOLR
1.0000 g | Freq: Once | INTRAMUSCULAR | Status: AC
  Administered 2013-12-31: 1 g via INTRAMUSCULAR

## 2013-12-31 MED ORDER — CEFTRIAXONE SODIUM 1 G IJ SOLR
INTRAMUSCULAR | Status: AC
  Filled 2013-12-31: qty 10

## 2013-12-31 MED ORDER — ALBUTEROL SULFATE HFA 108 (90 BASE) MCG/ACT IN AERS: 2.0000 | INHALATION_SPRAY | Freq: Four times a day (QID) | RESPIRATORY_TRACT | Status: DC

## 2013-12-31 MED ORDER — RANITIDINE HCL 150 MG PO TABS: 150.0000 mg | ORAL_TABLET | Freq: Two times a day (BID) | ORAL | Status: DC

## 2013-12-31 MED ORDER — LIDOCAINE HCL (PF) 1 % IJ SOLN
INTRAMUSCULAR | Status: AC
  Filled 2013-12-31: qty 5

## 2013-12-31 MED ORDER — AZITHROMYCIN 250 MG PO TABS: ORAL_TABLET | ORAL | Status: DC

## 2013-12-31 NOTE — ED Notes (Signed)
C/o productive cough for a week now States she has green yellowish mucous States household does have pneumonia Denies vomiting States she coughs some much her stomach aches

## 2013-12-31 NOTE — Discharge Instructions (Signed)

## 2013-12-31 NOTE — ED Provider Notes (Signed)
Chief Complaint   Chief Complaint  Patient presents with  . Cough    History of Present Illness   Charlotte Green is a 48 year old female who has had a one-week history of cough productive of yellow-green sputum with occasional blood, wheezing, tightness in the chest and chest pain. She smokes about half a pack of cigarettes per day. She's also had fever of up to 102, chills, and nasal congestion. She also has had a 3 to four-day history of epigastric abdominal pain which is worse when she coughs. This is been associated with nausea, a few loose stools, and some constipation. She is experienced headache, feels chilled, has extreme fatigue, and lack of appetite. She denies any sick exposures.  Review of Systems   Other than as noted above, the patient denies any of the following symptoms: Systemic:  No fevers, chills, sweats, or myalgias. Eye:  No redness or discharge. ENT:  No ear pain, headache, nasal congestion, drainage, sinus pressure, or sore throat. Neck:  No neck pain, stiffness, or swollen glands. Lungs:  No cough, sputum production, hemoptysis, wheezing, chest tightness, shortness of breath or chest pain. GI:  No abdominal pain, nausea, vomiting or diarrhea.  PMFSH   Past medical history, family history, social history, meds, and allergies were reviewed.   Physical exam   Vital signs:  BP 118/72  Pulse 84  Temp(Src) 99.3 F (37.4 C) (Oral)  Resp 20  SpO2 97%  LMP 02/06/2012 General:  Alert and oriented.  In no distress.  Skin warm and dry. Eye:  No conjunctival injection or drainage. Lids were normal. ENT:  TMs and canals were normal, without erythema or inflammation.  Nasal mucosa was clear and uncongested, without drainage.  Mucous membranes were moist.  Pharynx was clear with no exudate or drainage.  There were no oral ulcerations or lesions. Neck:  Supple, no adenopathy, tenderness or mass. Lungs:  No respiratory distress. She has some scattered wheezes in the  left midlung field posteriorly. No rales or rhonchi, good air movement bilaterally.  Heart:  Regular rhythm, without gallops, murmers or rubs. Abdomen: Soft and nondistended. There is slight epigastric tenderness to palpation without guarding or rebound. No organomegaly or mass. No pulsatile midline abdominal mass or bruit. Bowel sounds are normally active. Skin:  Clear, warm, and dry, without rash or lesions.  Radiology   Dg Chest 2 View  12/31/2013   CLINICAL DATA:  Productive cough and hemoptysis. History of chronic smoking.  EXAM: CHEST - 2 VIEW  COMPARISON:  11/07/2010  FINDINGS: Bronchial thickening and patchy infiltrates are identified in both lower lung zones. This may localize more to the region of the lingula and right middle lobe based on the lateral film. No edema, pleural fluid or pneumothorax. The heart size and mediastinal contours are within normal limits. No bony abnormalities.  IMPRESSION: Suspect bilateral pneumonia developing in the lingula and right middle lobe.   Electronically Signed   By: Irish Lack M.D.   On: 12/31/2013 11:26    Course in [facility]   Given Rocephin 1 g IM.  Assessment     The primary encounter diagnosis was Community acquired pneumonia. A diagnosis of Gastritis was also pertinent to this visit.  She does not have a primary care physician, so will need to see her here in 48 hours for recheck. I also urged her to return again in a month for a repeat chest x-ray. She was told that it was necessary to do this  to make sure that there was no evidence of tumor or cancer.  Plan    1.  Meds:  The following meds were prescribed:   Discharge Medication List as of 12/31/2013 11:41 AM    START taking these medications   Details  albuterol (PROVENTIL HFA;VENTOLIN HFA) 108 (90 BASE) MCG/ACT inhaler Inhale 2 puffs into the lungs 4 (four) times daily., Starting 12/31/2013, Until Discontinued, Normal    azithromycin (ZITHROMAX Z-PAK) 250 MG tablet Take  as directed., Normal    benzonatate (TESSALON) 200 MG capsule Take 1 capsule (200 mg total) by mouth 3 (three) times daily as needed for cough., Starting 12/31/2013, Until Discontinued, Normal    cefdinir (OMNICEF) 300 MG capsule Take 1 capsule (300 mg total) by mouth 2 (two) times daily., Starting 12/31/2013, Until Discontinued, Normal    ranitidine (ZANTAC) 150 MG tablet Take 1 tablet (150 mg total) by mouth 2 (two) times daily., Starting 12/31/2013, Until Discontinued, Normal        2.  Patient Education/Counseling:  The patient was given appropriate handouts, self care instructions, and instructed in symptomatic relief.  Instructed to get extra fluids, rest, and use a cool mist vaporizer.    3.  Follow up:  The patient was told to follow up here in 48 hours for a recheck, then again in 1 month for repeat chest x-ray, or sooner if becoming worse in any way, and given some red flag symptoms such as increasing fever, difficulty breathing, chest pain, or persistent vomiting which would prompt immediate return.       Reuben Likesavid C Markitta Ausburn, MD 12/31/13 [id number]

## 2014-05-02 ENCOUNTER — Emergency Department (HOSPITAL_COMMUNITY): Payer: BC Managed Care – PPO

## 2014-05-02 ENCOUNTER — Emergency Department (INDEPENDENT_AMBULATORY_CARE_PROVIDER_SITE_OTHER): Payer: BC Managed Care – PPO

## 2014-05-02 ENCOUNTER — Emergency Department (HOSPITAL_COMMUNITY)
Admission: EM | Admit: 2014-05-02 | Discharge: 2014-05-02 | Disposition: A | Discharge status: Home / Self Care | Payer: BC Managed Care – PPO | Attending: Emergency Medicine | Admitting: Emergency Medicine

## 2014-05-02 ENCOUNTER — Encounter (HOSPITAL_COMMUNITY): Payer: Self-pay | Admitting: Emergency Medicine

## 2014-05-02 ENCOUNTER — Emergency Department (HOSPITAL_COMMUNITY)
Admission: EM | Admit: 2014-05-02 | Discharge: 2014-05-02 | Disposition: A | Discharge status: Home / Self Care | Payer: BC Managed Care – PPO | Source: Home / Self Care | Attending: Emergency Medicine | Admitting: Emergency Medicine

## 2014-05-02 ENCOUNTER — Encounter (HOSPITAL_COMMUNITY): Payer: Self-pay | Admitting: *Deleted

## 2014-05-02 DIAGNOSIS — Y92092 Bedroom in other non-institutional residence as the place of occurrence of the external cause: Secondary | ICD-10-CM | POA: Diagnosis not present

## 2014-05-02 DIAGNOSIS — Z79899 Other long term (current) drug therapy: Secondary | ICD-10-CM | POA: Insufficient documentation

## 2014-05-02 DIAGNOSIS — W19XXXA Unspecified fall, initial encounter: Secondary | ICD-10-CM

## 2014-05-02 DIAGNOSIS — Z720 Tobacco use: Secondary | ICD-10-CM | POA: Insufficient documentation

## 2014-05-02 DIAGNOSIS — H9190 Unspecified hearing loss, unspecified ear: Secondary | ICD-10-CM | POA: Insufficient documentation

## 2014-05-02 DIAGNOSIS — Y998 Other external cause status: Secondary | ICD-10-CM | POA: Diagnosis not present

## 2014-05-02 DIAGNOSIS — R110 Nausea: Secondary | ICD-10-CM | POA: Insufficient documentation

## 2014-05-02 DIAGNOSIS — X58XXXA Exposure to other specified factors, initial encounter: Secondary | ICD-10-CM | POA: Diagnosis not present

## 2014-05-02 DIAGNOSIS — M79604 Pain in right leg: Secondary | ICD-10-CM

## 2014-05-02 DIAGNOSIS — M79601 Pain in right arm: Secondary | ICD-10-CM

## 2014-05-02 DIAGNOSIS — Y9339 Activity, other involving climbing, rappelling and jumping off: Secondary | ICD-10-CM | POA: Insufficient documentation

## 2014-05-02 DIAGNOSIS — M25561 Pain in right knee: Secondary | ICD-10-CM

## 2014-05-02 DIAGNOSIS — Z792 Long term (current) use of antibiotics: Secondary | ICD-10-CM | POA: Diagnosis not present

## 2014-05-02 DIAGNOSIS — S8991XA Unspecified injury of right lower leg, initial encounter: Secondary | ICD-10-CM | POA: Diagnosis not present

## 2014-05-02 LAB — I-STAT CHEM 8, ED
BUN: 17 mg/dL (ref 6–23)
Calcium, Ion: 1.17 mmol/L (ref 1.12–1.23)
Chloride: 103 mEq/L (ref 96–112)
Creatinine, Ser: 0.8 mg/dL (ref 0.50–1.10)
Glucose, Bld: 120 mg/dL — ABNORMAL HIGH (ref 70–99)
HCT: 48 % — ABNORMAL HIGH (ref 36.0–46.0)
Hemoglobin: 16.3 g/dL — ABNORMAL HIGH (ref 12.0–15.0)
Potassium: 3.7 mEq/L (ref 3.7–5.3)
Sodium: 140 mEq/L (ref 137–147)
TCO2: 21 mmol/L (ref 0–100)

## 2014-05-02 MED ORDER — OXYCODONE HCL 5 MG PO TABS: 5.0000 mg | ORAL_TABLET | ORAL | Status: DC | PRN

## 2014-05-02 MED ORDER — HYDROMORPHONE HCL 1 MG/ML IJ SOLN
INTRAMUSCULAR | Status: AC
  Filled 2014-05-02: qty 1

## 2014-05-02 MED ORDER — HYDROMORPHONE HCL 1 MG/ML IJ SOLN
1.0000 mg | Freq: Once | INTRAMUSCULAR | Status: AC
  Administered 2014-05-02: 1 mg via INTRAMUSCULAR

## 2014-05-02 MED ORDER — IOHEXOL 350 MG/ML SOLN
100.0000 mL | Freq: Once | INTRAVENOUS | Status: AC | PRN
  Administered 2014-05-02: 100 mL via INTRAVENOUS

## 2014-05-02 MED ORDER — MORPHINE SULFATE 4 MG/ML IJ SOLN
4.0000 mg | Freq: Once | INTRAMUSCULAR | Status: AC
  Administered 2014-05-02: 4 mg via INTRAVENOUS
  Filled 2014-05-02: qty 1

## 2014-05-02 NOTE — Discharge Instructions (Signed)
Read the information below.  Use the prescribed medication as directed.  Please discuss all new medications with your pharmacist.  You may return to the Emergency Department at any time for worsening condition or any new symptoms that concern you.  If there is any possibility that you might be pregnant, please let your health care provider know and discuss this with the pharmacist to ensure medication safety.  If you develop uncontrolled pain, weakness or numbness of the extremity, severe discoloration of the skin, or you are unable to move your foot or walk, return to the ER for a recheck.       Knee Pain The knee is the complex joint between your thigh and your lower leg. It is made up of bones, tendons, ligaments, and cartilage. The bones that make up the knee are:  The femur in the thigh.  The tibia and fibula in the lower leg.  The patella or kneecap riding in the groove on the lower femur. CAUSES  Knee pain is a common complaint with many causes. A few of these causes are:  Injury, such as:  A ruptured ligament or tendon injury.  Torn cartilage.  Medical conditions, such as:  Gout  Arthritis  Infections  Overuse, over training, or overdoing a physical activity. Knee pain can be minor or severe. Knee pain can accompany debilitating injury. Minor knee problems often respond well to self-care measures or get well on their own. More serious injuries may need medical intervention or even surgery. SYMPTOMS The knee is complex. Symptoms of knee problems can vary widely. Some of the problems are:  Pain with movement and weight bearing.  Swelling and tenderness.  Buckling of the knee.  Inability to straighten or extend your knee.  Your knee locks and you cannot straighten it.  Warmth and redness with pain and fever.  Deformity or dislocation of the kneecap. DIAGNOSIS  Determining what is wrong may be very straight forward such as when there is an injury. It can also be  challenging because of the complexity of the knee. Tests to make a diagnosis may include:  Your caregiver taking a history and doing a physical exam.  Routine X-rays can be used to rule out other problems. X-rays will not reveal a cartilage tear. Some injuries of the knee can be diagnosed by:  Arthroscopy a surgical technique by which a small video camera is inserted through tiny incisions on the sides of the knee. This procedure is used to examine and repair internal knee joint problems. Tiny instruments can be used during arthroscopy to repair the torn knee cartilage (meniscus).  Arthrography is a radiology technique. A contrast liquid is directly injected into the knee joint. Internal structures of the knee joint then become visible on X-ray film.  An MRI scan is a non X-ray radiology procedure in which magnetic fields and a computer produce two- or three-dimensional images of the inside of the knee. Cartilage tears are often visible using an MRI scanner. MRI scans have largely replaced arthrography in diagnosing cartilage tears of the knee.  Blood work.  Examination of the fluid that helps to lubricate the knee joint (synovial fluid). This is done by taking a sample out using a needle and a syringe. TREATMENT The treatment of knee problems depends on the cause. Some of these treatments are:  Depending on the injury, proper casting, splinting, surgery, or physical therapy care will be needed.  Give yourself adequate recovery time. Do not overuse your joints. If  you begin to get sore during workout routines, back off. Slow down or do fewer repetitions.  For repetitive activities such as cycling or running, maintain your strength and nutrition.  Alternate muscle groups. For example, if you are a weight lifter, work the upper body on one day and the lower body the next.  Either tight or weak muscles do not give the proper support for your knee. Tight or weak muscles do not absorb the stress  placed on the knee joint. Keep the muscles surrounding the knee strong.  Take care of mechanical problems.  If you have flat feet, orthotics or special shoes may help. See your caregiver if you need help.  Arch supports, sometimes with wedges on the inner or outer aspect of the heel, can help. These can shift pressure away from the side of the knee most bothered by osteoarthritis.  A brace called an "unloader" brace also may be used to help ease the pressure on the most arthritic side of the knee.  If your caregiver has prescribed crutches, braces, wraps or ice, use as directed. The acronym for this is PRICE. This means protection, rest, ice, compression, and elevation.  Nonsteroidal anti-inflammatory drugs (NSAIDs), can help relieve pain. But if taken immediately after an injury, they may actually increase swelling. Take NSAIDs with food in your stomach. Stop them if you develop stomach problems. Do not take these if you have a history of ulcers, stomach pain, or bleeding from the bowel. Do not take without your caregiver's approval if you have problems with fluid retention, heart failure, or kidney problems.  For ongoing knee problems, physical therapy may be helpful.  Glucosamine and chondroitin are over-the-counter dietary supplements. Both may help relieve the pain of osteoarthritis in the knee. These medicines are different from the usual anti-inflammatory drugs. Glucosamine may decrease the rate of cartilage destruction.  Injections of a corticosteroid drug into your knee joint may help reduce the symptoms of an arthritis flare-up. They may provide pain relief that lasts a few months. You may have to wait a few months between injections. The injections do have a small increased risk of infection, water retention, and elevated blood sugar levels.  Hyaluronic acid injected into damaged joints may ease pain and provide lubrication. These injections may work by reducing inflammation. A series  of shots may give relief for as long as 6 months.  Topical painkillers. Applying certain ointments to your skin may help relieve the pain and stiffness of osteoarthritis. Ask your pharmacist for suggestions. Many over the-counter products are approved for temporary relief of arthritis pain.  In some countries, doctors often prescribe topical NSAIDs for relief of chronic conditions such as arthritis and tendinitis. A review of treatment with NSAID creams found that they worked as well as oral medications but without the serious side effects. PREVENTION  Maintain a healthy weight. Extra pounds put more strain on your joints.  Get strong, stay limber. Weak muscles are a common cause of knee injuries. Stretching is important. Include flexibility exercises in your workouts.  Be smart about exercise. If you have osteoarthritis, chronic knee pain or recurring injuries, you may need to change the way you exercise. This does not mean you have to stop being active. If your knees ache after jogging or playing basketball, consider switching to swimming, water aerobics, or other low-impact activities, at least for a few days a week. Sometimes limiting high-impact activities will provide relief.  Make sure your shoes fit well. Choose footwear that  is right for your sport.  Protect your knees. Use the proper gear for knee-sensitive activities. Use kneepads when playing volleyball or laying carpet. Buckle your seat belt every time you drive. Most shattered kneecaps occur in car accidents.  Rest when you are tired. SEEK MEDICAL CARE IF:  You have knee pain that is continual and does not seem to be getting better.  SEEK IMMEDIATE MEDICAL CARE IF:  Your knee joint feels hot to the touch and you have a high fever. MAKE SURE YOU:   Understand these instructions.  Will watch your condition.  Will get help right away if you are not doing well or get worse. Document Released: 03/08/2007 Document Revised:  08/03/2011 Document Reviewed: 03/08/2007 [facility] Patient Information 2015 [employer] [place]. This information is not intended to replace advice given to you by your health care provider. Make sure you discuss any questions you have with your health care provider.

## 2014-05-02 NOTE — ED Provider Notes (Signed)
CSN: [id number]     Arrival date & time 05/02/14  1443 History  This chart was scribed for non-physician practitioner, Trixie DredgeEmily Stanlee Roehrig, PA-C, working with Arby BarretteMarcy Pfeiffer, MD by Charline BillsEssence Howell, ED Scribe. This patient was seen in room WTR8/WTR8 and the patient's care was started at 3:51 PM.   Chief Complaint  Patient presents with  . Leg Pain   The history is provided by the patient. No language interpreter was used.   HPI Comments: Delena BaliDarlene C Green is a 48 y.o. female, with no pertinent medical history, who presents to the Emergency Department complaining of sudden onset of constant R knee pain since yesterday. Pt was seen at Urgent Care earlier today for symptoms but referred to the ED. She reports that she jumped to get onto a high bed when her foot got caught on the edge of the bed. Pt reports that her knee twisted upon impact. Pt states that her knee felt "stuck" and was turned inward following the incident. She describes the pain as a sharp, aching sensation that radiates from her R buttocks to her R foot. Pt rates her pain 8/10. Pain is worse behind the R knee. Pain is improved with applying constant pressure. She reports associated numbness/tingling sensation in her R foot and nausea that she attributes to severity of pain. Pt denies fever, abdominal pain, chest pain, abdominal pain, urinary or bowel incontinence, any urinary symptoms. Pt has been treating with ice and elevation without relief.   Past Medical History  Diagnosis Date  . No pertinent past medical history   . Hearing loss     lt p hit in head 10/11   Past Surgical History  Procedure Laterality Date  . Cervical disc surgery  2008  . Carpal tunnel release  2008    lt  . Cervical fusion  2008  . Tympanoplasty  06/05/2011    Procedure: TYMPANOPLASTY;  Surgeon: Drema Halonhristopher E Newman, MD;  Location: [facility];  Service: ENT;  Laterality: Left;  with ossicular reconstruction w/ Cartilage Graft   No family history on  file. History  Substance Use Topics  . Smoking status: Current Every Day Smoker -- 0.50 packs/day  . Smokeless tobacco: Not on file  . Alcohol Use: Yes   OB History    No data available     Review of Systems  Constitutional: Negative for fever.  Cardiovascular: Negative for chest pain.  Gastrointestinal: Positive for nausea. Negative for abdominal pain.  Genitourinary: Negative.   Musculoskeletal: Positive for myalgias.  Neurological: Positive for numbness.  All other systems reviewed and are negative.  Allergies  Tylenol and Codeine  Home Medications   Prior to Admission medications   Medication Sig Start Date End Date Taking? Authorizing Provider  albuterol (PROVENTIL HFA;VENTOLIN HFA) 108 (90 BASE) MCG/ACT inhaler Inhale 2 puffs into the lungs 4 (four) times daily. 12/31/13   Reuben Likesavid C Keller, MD  amoxicillin (AMOXIL) 500 MG capsule Take 1 capsule (500 mg total) by mouth 3 (three) times daily. 08/22/13   Arie Sabinaatherine E Schinlever, PA-C  azithromycin (ZITHROMAX Z-PAK) 250 MG tablet Take as directed. 12/31/13   Reuben Likesavid C Keller, MD  benzonatate (TESSALON) 200 MG capsule Take 1 capsule (200 mg total) by mouth 3 (three) times daily as needed for cough. 12/31/13   Reuben Likesavid C Keller, MD  Camphor-Eucalyptus-Menthol [facility](VICKS VAPORUB EX) Apply 1 application topically as needed (congestion).    Historical Provider, MD  cefdinir (OMNICEF) 300 MG capsule Take 1 capsule (300 mg total) by  mouth 2 (two) times daily. 12/31/13   Reuben Likes, MD  chlorpheniramine-HYDROcodone [facility] PENNKINETIC ER) 10-8 MG/5ML LQCR Take 5 mLs by mouth every 12 (twelve) hours as needed for cough. 08/22/13   Arie Sabina Schinlever, PA-C  ibuprofen (ADVIL,MOTRIN) 200 MG tablet Take 600 mg by mouth every 6 (six) hours as needed for fever or moderate pain. For pain    Historical Provider, MD  naphazoline-glycerin (CLEAR EYES) 0.012-0.2 % SOLN Place 1-2 drops into both eyes every morning.    Historical Provider, MD   Pseudoeph-Doxylamine-DM-APAP (NYQUIL PO) Take 30 mLs by mouth at bedtime as needed (cold symptoms).    Historical Provider, MD  Pseudoephedrine-APAP-DM (DAYQUIL PO) Take 2 capsules by mouth every 6 (six) hours as needed (cold symptoms).    Historical Provider, MD  ranitidine (ZANTAC) 150 MG tablet Take 1 tablet (150 mg total) by mouth 2 (two) times daily. 12/31/13   Reuben Likes, MD   Triage Vitals: BP 118/56 mmHg  Pulse 89  Temp(Src) 98 F (36.7 C) (Oral)  Resp 20  SpO2 100%  LMP 02/06/2012 Physical Exam  Constitutional: She appears well-developed and well-nourished. No distress.  HENT:  Head: Normocephalic and atraumatic.  Neck: Neck supple.  Cardiovascular:  Palpable DP and PT pulses present and equal bilaterally.  Pulmonary/Chest: Effort normal.  Musculoskeletal:  Diffusely tender to posterior knee.No other tenderness. No erythema, edema, warmth, discharge, or tenderness. Active ROM with slight decreased to full extension and 90 flexion. No laxity of the joint but pain with valgus and varus stress. Altered sensation throughout R foot. Full ROM of R digits and ankle. Ankle and hip without pain. Spine nontender, no crepitus, or stepoffs.  Neurological: She is alert.  Skin: She is not diaphoretic.  Nursing note and vitals reviewed.  ED Course  Procedures (including critical care time) DIAGNOSTIC STUDIES: Oxygen Saturation is 100% on RA, normal by my interpretation.    COORDINATION OF CARE: 4:07 PM-Discussed treatment plan which includes CT, knee immobilizer, crutches and follow-up with orthopedist with pt at bedside and pt agreed to plan.   Labs Review Labs Reviewed  I-STAT CHEM 8, ED - Abnormal; Notable for the following:    Glucose, Bld 120 (*)    Hemoglobin 16.3 (*)    HCT 48.0 (*)    All other components within normal limits  POC URINE PREG, ED   Imaging Review Ct Angio Low Extrem Right W/cm &/or Wo/cm  05/02/2014   CLINICAL DATA:  Injury to the right leg. Unable to  bear weight and numbness in the right foot. Evaluate for arterial injury.  EXAM: CT ANGIOGRAPHY OF THE RIGHT LOWEREXTREMITY  TECHNIQUE: Multidetector CT imaging of the right lower extremitywas performed using the standard protocol during bolus administration of intravenous contrast. Multiplanar CT image reconstructions and MIPs were obtained to evaluate the vascular anatomy.  CONTRAST:  OMNIPAQUE IOHEXOL 350 MG/ML SOLN  COMPARISON:  Knee radiographs 05/02/2014  FINDINGS: Arterial structures: The abdominal aorta is patent without aneurysm. Mild atherosclerotic plaque in the abdominal aorta. The SMA, IMA and bilateral renal arteries are patent without significant stenosis. Incidentally, there are bilateral accessory inferior renal arteries. The iliac arteries are patent without significant stenosis or plaque.  The right common femoral artery, right SFA and the deep right femoral arteries are patent. No significant plaque and no focal stenosis in the right lower extremity arteries. Right popliteal artery is patent. There is 3 vessel runoff in the right calf. There is flow across the ankle from the  posterior tibial artery and probably the dorsalis pedis artery.  Review of the MIP images confirms the above findings.  Nonvascular:  No acute bone abnormality in the right lower extremity or visualized pelvic bones. The visualized intra-abdominal structures are unremarkable. There is no evidence for lymphadenopathy or free fluid. There is a small suprapatellar right knee joint effusion. The right patella is located.  IMPRESSION: Normal arterial flow in the right lower extremity. No evidence for stenosis, injury or occlusion.  Small right suprapatellar knee joint effusion. No acute bone abnormality.  Mild atherosclerotic disease in the distal abdominal aorta. Incidentally, the patient has accessory renal arteries.   Electronically Signed   By: Richarda OverlieAdam  Henn M.D.   On: 05/02/2014 18:10   Dg Knee Complete 4 Views  Right  05/02/2014   CLINICAL DATA:  Twisting injury yesterday. Knee pain with limited extension. Initial encounter.  EXAM: RIGHT KNEE - COMPLETE 4+ VIEW  COMPARISON:  None.  FINDINGS: The mineralization and alignment are normal. There is no evidence of acute fracture or dislocation. The joint spaces are preserved. There is no focal soft tissue swelling or knee joint effusion.  IMPRESSION: Negative right knee radiographs.   Electronically Signed   By: Roxy HorsemanBill  Veazey M.D.   On: 05/02/2014 12:38    EKG Interpretation None      MDM   Final diagnoses:  Knee pain, acute, right   Afebrile, nontoxic patient with right knee pain after twisting injury yesterday.  Unclear story with pt unsure about dislocation.  Considered low risk for this given the mechanism of injury but pt with severe pain, palpable but faint pulses in feet (though fairly equal bilaterally).  CT angio lower extremity negative for vascular injury.  Joint effusion present.   D/C home with knee immobilizer, crutches, pain medication, orthopedic follow up.   Discussed result, findings, treatment, and follow up  with patient.  Pt given return precautions.  Pt verbalizes understanding and agrees with plan.       I personally performed the services described in this documentation, which was scribed in my presence. The recorded information has been reviewed and is accurate.    Trixie Dredgemily Ambrie Carte, PA-C 05/02/14 1850  Arby BarretteMarcy Pfeiffer, MD 05/03/14 [phone]

## 2014-05-02 NOTE — ED Notes (Signed)
Pt was seen at urgent care earlier for symptoms. Pt states yesterday she injured leg getting off high bed. Pt state she is unable to bear weight on it. Pt has scans done at urgent care but sent over here for further testing.

## 2014-05-02 NOTE — ED Provider Notes (Signed)
CSN: [id number]     Arrival date & time 05/02/14  1104 History   None    Chief Complaint  Patient presents with  . Knee Injury   (Consider location/radiation/quality/duration/timing/severity/associated sxs/prior Treatment) HPI         48 year old female presents complaining of right knee pain. She was trying to jump onto a bed 2 days ago when she had a twisting injury and acute onset of pain in her right leg. This pain goes from her posterior thigh around her buttock down to her foot, worse in the back of the knee. It is constant, worse with any attempted ambulation. She cannot put any weight on her foot and has been completely nonambulatory since this happened. She also has subjective numbness in her foot and her knee feels swollen. She says the pain is severe, 10 out of 10 in severity. No previous history of knee injuries, back injuries, all his injuries.  Past Medical History  Diagnosis Date  . No pertinent past medical history   . Hearing loss     lt p hit in head 10/11   Past Surgical History  Procedure Laterality Date  . Cervical disc surgery  2008  . Carpal tunnel release  2008    lt  . Cervical fusion  2008  . Tympanoplasty  06/05/2011    Procedure: TYMPANOPLASTY;  Surgeon: Drema Halonhristopher E Newman, MD;  Location: [facility];  Service: ENT;  Laterality: Left;  with ossicular reconstruction w/ Cartilage Graft   History reviewed. No pertinent family history. History  Substance Use Topics  . Smoking status: Current Every Day Smoker -- 0.50 packs/day  . Smokeless tobacco: Not on file  . Alcohol Use: Yes   OB History    No data available     Review of Systems  Musculoskeletal: Negative for joint swelling.       Right leg and knee pain  All other systems reviewed and are negative.   Allergies  Tylenol and Codeine  Home Medications   Prior to Admission medications   Medication Sig Start Date End Date Taking? Authorizing Provider  albuterol (PROVENTIL  HFA;VENTOLIN HFA) 108 (90 BASE) MCG/ACT inhaler Inhale 2 puffs into the lungs 4 (four) times daily. 12/31/13   Reuben Likesavid C Keller, MD  amoxicillin (AMOXIL) 500 MG capsule Take 1 capsule (500 mg total) by mouth 3 (three) times daily. 08/22/13   Arie Sabinaatherine E Schinlever, PA-C  azithromycin (ZITHROMAX Z-PAK) 250 MG tablet Take as directed. 12/31/13   Reuben Likesavid C Keller, MD  benzonatate (TESSALON) 200 MG capsule Take 1 capsule (200 mg total) by mouth 3 (three) times daily as needed for cough. 12/31/13   Reuben Likesavid C Keller, MD  Camphor-Eucalyptus-Menthol [facility](VICKS VAPORUB EX) Apply 1 application topically as needed (congestion).    Historical Provider, MD  cefdinir (OMNICEF) 300 MG capsule Take 1 capsule (300 mg total) by mouth 2 (two) times daily. 12/31/13   Reuben Likesavid C Keller, MD  chlorpheniramine-HYDROcodone [facility](TUSSIONEX PENNKINETIC ER) 10-8 MG/5ML LQCR Take 5 mLs by mouth every 12 (twelve) hours as needed for cough. 08/22/13   Arie Sabinaatherine E Schinlever, PA-C  ibuprofen (ADVIL,MOTRIN) 200 MG tablet Take 600 mg by mouth every 6 (six) hours as needed for fever or moderate pain. For pain    Historical Provider, MD  naphazoline-glycerin (CLEAR EYES) 0.012-0.2 % SOLN Place 1-2 drops into both eyes every morning.    Historical Provider, MD  Pseudoeph-Doxylamine-DM-APAP (NYQUIL PO) Take 30 mLs by mouth at bedtime as needed (cold symptoms).  Historical Provider, MD  Pseudoephedrine-APAP-DM (DAYQUIL PO) Take 2 capsules by mouth every 6 (six) hours as needed (cold symptoms).    Historical Provider, MD  ranitidine (ZANTAC) 150 MG tablet Take 1 tablet (150 mg total) by mouth 2 (two) times daily. 12/31/13   Reuben Likesavid C Keller, MD   BP 120/80 mmHg  Pulse 100  Temp(Src) 98.6 F (37 C) (Oral)  Resp 18  SpO2 100%  LMP 02/06/2012 Physical Exam  Constitutional: She is oriented to person, place, and time. Vital signs are normal. She appears well-developed and well-nourished. She appears distressed.  HENT:  Head: Normocephalic and atraumatic.   Cardiovascular:  Pulses:      Dorsalis pedis pulses are 2+ on the right side, and 2+ on the left side.  Pulmonary/Chest: Effort normal. No respiratory distress.  Musculoskeletal:       Right knee: She exhibits decreased range of motion and bony tenderness. She exhibits no swelling, no effusion and no deformity. Tenderness (diffuse) found.       Lumbar back: She exhibits decreased range of motion and tenderness (L4).  She would not allow me to perform a physical exam, she says the pain is too severe  Neurological: She is alert and oriented to person, place, and time. She has normal strength. A sensory deficit (decreased sensation in the right foot) is present. Gait ( not ambulatory) abnormal. Coordination normal.  Skin: Skin is warm and dry. No rash noted. She is not diaphoretic.  Psychiatric: She has a normal mood and affect. Judgment normal.  Nursing note and vitals reviewed.   ED Course  Procedures (including critical care time) Labs Review Labs Reviewed - No data to display  Imaging Review Dg Knee Complete 4 Views Right  05/02/2014   CLINICAL DATA:  Twisting injury yesterday. Knee pain with limited extension. Initial encounter.  EXAM: RIGHT KNEE - COMPLETE 4+ VIEW  COMPARISON:  None.  FINDINGS: The mineralization and alignment are normal. There is no evidence of acute fracture or dislocation. The joint spaces are preserved. There is no focal soft tissue swelling or knee joint effusion.  IMPRESSION: Negative right knee radiographs.   Electronically Signed   By: Roxy HorsemanBill  Veazey M.D.   On: 05/02/2014 12:38     MDM   1. Acute leg pain, right   2. Knee pain, acute, right   3. Fall, initial encounter    Pt's right leg pain is severe, she is unable to ambulate.  There is a very small amount of swelling in the knee but otherwise objectively she is normal.  She is non-ambulatory  With subjective numbness in her right foot which is concerning.  Being unable to perform an exam due to the pain  the pt is in, cannot r/o hip pathology and cannot evaluate for PE signs of neuropathic pain or CES.  She will be transferred to ED via personal vehicle for further eval. she was given 1 mg of Dilaudid IM here prior to transfer   Meds ordered this encounter  Medications  . HYDROmorphone (DILAUDID) injection 1 mg    Sig:      Graylon GoodZachary H Kiyana Vazguez, PA-C 05/02/14 1328

## 2014-05-02 NOTE — Discharge Instructions (Signed)

## 2014-05-02 NOTE — ED Notes (Signed)
Pt  Reports  She injured  Her r  Knee  Last  Pm    She  States      She  Injured  It  Getting  Off  A  High bed     - she  Has  Pain on weight  Bearing  And  On  Certain movements     She  Reports  Swelling  As  Well as  Pain on palpation

## 2014-05-02 NOTE — ED Notes (Signed)
Pt aware of the need for a urine sample. 

## 2016-08-14 ENCOUNTER — Encounter (HOSPITAL_COMMUNITY): Payer: Self-pay | Admitting: Emergency Medicine

## 2016-08-14 ENCOUNTER — Emergency Department (HOSPITAL_COMMUNITY)
Admission: EM | Admit: 2016-08-14 | Discharge: 2016-08-14 | Disposition: A | Discharge status: Home / Self Care | Payer: BLUE CROSS/BLUE SHIELD | Attending: Emergency Medicine | Admitting: Emergency Medicine

## 2016-08-14 DIAGNOSIS — R202 Paresthesia of skin: Secondary | ICD-10-CM

## 2016-08-14 DIAGNOSIS — Z79899 Other long term (current) drug therapy: Secondary | ICD-10-CM | POA: Insufficient documentation

## 2016-08-14 DIAGNOSIS — R110 Nausea: Secondary | ICD-10-CM | POA: Diagnosis not present

## 2016-08-14 DIAGNOSIS — M62830 Muscle spasm of back: Secondary | ICD-10-CM

## 2016-08-14 DIAGNOSIS — M62838 Other muscle spasm: Secondary | ICD-10-CM | POA: Diagnosis not present

## 2016-08-14 DIAGNOSIS — M545 Low back pain, unspecified: Secondary | ICD-10-CM

## 2016-08-14 DIAGNOSIS — Y939 Activity, unspecified: Secondary | ICD-10-CM | POA: Diagnosis not present

## 2016-08-14 DIAGNOSIS — S161XXA Strain of muscle, fascia and tendon at neck level, initial encounter: Secondary | ICD-10-CM

## 2016-08-14 DIAGNOSIS — Y999 Unspecified external cause status: Secondary | ICD-10-CM | POA: Diagnosis not present

## 2016-08-14 DIAGNOSIS — F17200 Nicotine dependence, unspecified, uncomplicated: Secondary | ICD-10-CM | POA: Insufficient documentation

## 2016-08-14 DIAGNOSIS — Y92410 Unspecified street and highway as the place of occurrence of the external cause: Secondary | ICD-10-CM | POA: Insufficient documentation

## 2016-08-14 DIAGNOSIS — S199XXA Unspecified injury of neck, initial encounter: Secondary | ICD-10-CM | POA: Diagnosis present

## 2016-08-14 MED ORDER — RANITIDINE HCL 150 MG PO TABS: 150.0000 mg | ORAL_TABLET | Freq: Two times a day (BID) | ORAL | 0 refills | Status: DC

## 2016-08-14 MED ORDER — NAPROXEN 500 MG PO TABS: 500.0000 mg | ORAL_TABLET | Freq: Two times a day (BID) | ORAL | 0 refills | Status: AC

## 2016-08-14 MED ORDER — ONDANSETRON 4 MG PO TBDP: 4.0000 mg | ORAL_TABLET | Freq: Three times a day (TID) | ORAL | 0 refills | Status: DC | PRN

## 2016-08-14 MED ORDER — CYCLOBENZAPRINE HCL 10 MG PO TABS: 10.0000 mg | ORAL_TABLET | Freq: Three times a day (TID) | ORAL | 0 refills | Status: DC | PRN
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
## 2016-08-14 NOTE — Discharge Instructions (Signed)
Take naprosyn as directed for inflammation and pain along with zantac to help protect your stomach while taking naprosyn. Use flexeril as directed as needed for muscle spasms/relaxation. Do not drive or operate machinery with muscle relaxant use. Use heat to areas of soreness, no more than 20 minutes at a time every hour. Use zofran as directed as needed for nausea; stay well hydrated. Follow up with primary care physician for recheck of ongoing symptoms in the next 1-2 weeks. Return to ER for emergent changing or worsening of symptoms.

## 2018-07-17 ENCOUNTER — Encounter (HOSPITAL_COMMUNITY): Payer: Self-pay

## 2018-07-17 ENCOUNTER — Ambulatory Visit (HOSPITAL_COMMUNITY)
Admission: EM | Admit: 2018-07-17 | Discharge: 2018-07-17 | Disposition: A | Discharge status: Home / Self Care | Payer: 59 | Attending: Internal Medicine | Admitting: Internal Medicine

## 2018-07-17 DIAGNOSIS — H66001 Acute suppurative otitis media without spontaneous rupture of ear drum, right ear: Secondary | ICD-10-CM | POA: Diagnosis not present

## 2018-07-17 MED ORDER — AMOXICILLIN-POT CLAVULANATE 875-125 MG PO TABS: 1.0000 | ORAL_TABLET | Freq: Two times a day (BID) | ORAL | 0 refills | Status: AC

## 2018-07-17 NOTE — Discharge Instructions (Signed)
Begin Augmentin twice daily  for the next week  May try neosprorin, vaseline, aquaphor, eucerin, or cerave at canal opening

## 2018-07-17 NOTE — ED Triage Notes (Signed)
Pt presents with chronic ear fullness and pain in both ears
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
## 2018-08-16 ENCOUNTER — Encounter (HOSPITAL_COMMUNITY): Payer: Self-pay | Admitting: Emergency Medicine

## 2018-08-16 ENCOUNTER — Other Ambulatory Visit: Payer: Self-pay

## 2018-08-16 ENCOUNTER — Ambulatory Visit (HOSPITAL_COMMUNITY)
Admission: EM | Admit: 2018-08-16 | Discharge: 2018-08-16 | Disposition: A | Discharge status: Home / Self Care | Payer: 59 | Attending: Family Medicine | Admitting: Family Medicine

## 2018-08-16 DIAGNOSIS — B349 Viral infection, unspecified: Secondary | ICD-10-CM

## 2018-08-16 MED ORDER — FLUTICASONE PROPIONATE 50 MCG/ACT NA SUSP: 1.0000 | Freq: Every day | NASAL | 2 refills | Status: DC

## 2018-08-16 MED ORDER — CETIRIZINE HCL 10 MG PO TABS: 10.0000 mg | ORAL_TABLET | Freq: Every day | ORAL | 0 refills | Status: DC

## 2018-08-16 NOTE — ED Notes (Signed)
Bed: UC09 Expected date:  Expected time:  Means of arrival:  Comments: 1130 Appt

## 2018-08-16 NOTE — ED Triage Notes (Signed)
Pt here with URI sx x 4 days  

## 2018-08-16 NOTE — Discharge Instructions (Addendum)
Your symptoms are most likely  viral or allergy related.  We will do Flonase nasal spray and Zyrtec daily for your symptoms Work note given

## 2018-08-16 NOTE — ED Notes (Signed)
Patient verbalizes understanding of discharge instructions. Opportunity for questioning and answers were provided. Patient discharged from [facility] by RN.  

## 2018-08-16 NOTE — ED Provider Notes (Signed)
[facility]    CSN: [id number] Arrival date & time: 08/16/18  1127     History   Chief Complaint Chief Complaint  Patient presents with  . URI  . Generalized Body Aches    HPI Charlotte Green is a 53 y.o. female.   Patient is a 53 year old female that presents with generalized body aches, sinus headache, chills, fatigue.  This all started approximate 5 days ago.  Symptoms started after she finished her shift at work on Thursday.  Symptoms have been constant but waxing and waning.  Reports that she has been at home since and has not left the house.  She has mostly been laying in the bed.  Denies any significant cough or congestion denies.  Denies any chest pain or shortness of breath.  She has taken ibuprofen for headache with mild relief.  She does have a history of allergies.  Denies any recent sick contacts or recent traveling.  ROS per HPI    URI    Past Medical History:  Diagnosis Date  . Hearing loss    lt p hit in head 10/11  . No pertinent past medical history     There are no active problems to display for this patient.   Past Surgical History:  Procedure Laterality Date  . CARPAL TUNNEL RELEASE  2008   lt  . CERVICAL DISC SURGERY  2008  . CERVICAL FUSION  2008  . TYMPANOPLASTY  06/05/2011   Procedure: TYMPANOPLASTY;  Surgeon: Drema Halon, MD;  Location: [facility];  Service: ENT;  Laterality: Left;  with ossicular reconstruction w/ Cartilage Graft    OB History   No obstetric history on file.      Home Medications    Prior to Admission medications   Medication Sig Start Date End Date Taking? Authorizing Provider  albuterol (PROVENTIL HFA;VENTOLIN HFA) 108 (90 BASE) MCG/ACT inhaler Inhale 2 puffs into the lungs 4 (four) times daily. 12/31/13   Reuben Likes, MD  azithromycin (ZITHROMAX Z-PAK) 250 MG tablet Take as directed. 12/31/13   Reuben Likes, MD  Camphor-Eucalyptus-Menthol [facility] VAPORUB EX) Apply 1  application topically as needed (congestion).    [provider]  cefdinir (OMNICEF) 300 MG capsule Take 1 capsule (300 mg total) by mouth 2 (two) times daily. 12/31/13   Reuben Likes, MD  cetirizine (ZYRTEC) 10 MG tablet Take 1 tablet (10 mg total) by mouth daily. 08/16/18   Janace Aris, NP  chlorpheniramine-HYDROcodone (TUSSIONEX PENNKINETIC ER) 10-8 MG/5ML LQCR Take 5 mLs by mouth every 12 (twelve) hours as needed for cough. 08/22/13   Schinlever, Santina Evans, PA-C  cyclobenzaprine (FLEXERIL) 10 MG tablet Take 1 tablet (10 mg total) by mouth 3 (three) times daily as needed for muscle spasms. 08/14/16   Street, [place], PA-C  fluticasone (FLONASE) 50 MCG/ACT nasal spray Place 1 spray into both nostrils daily. 08/16/18   Dahlia Byes A, NP  ibuprofen (ADVIL,MOTRIN) 200 MG tablet Take 600 mg by mouth every 6 (six) hours as needed for fever or moderate pain. For pain    [provider]  naphazoline-glycerin (CLEAR EYES) 0.012-0.2 % SOLN Place 1-2 drops into both eyes every morning.    [provider]  ondansetron (ZOFRAN ODT) 4 MG disintegrating tablet Take 1 tablet (4 mg total) by mouth every 8 (eight) hours as needed for nausea or vomiting. 08/14/16   Street, [place], PA-C  oxyCODONE (ROXICODONE) 5 MG immediate release tablet Take 1 tablet (5  mg total) by mouth every 4 (four) hours as needed for severe pain. 05/02/14   Trixie Dredge, PA-C  Pseudoeph-Doxylamine-DM-APAP (NYQUIL PO) Take 30 mLs by mouth at bedtime as needed (cold symptoms).    [provider]  Pseudoephedrine-APAP-DM (DAYQUIL PO) Take 2 capsules by mouth every 6 (six) hours as needed (cold symptoms).    [provider]  ranitidine (ZANTAC) 150 MG tablet Take 1 tablet (150 mg total) by mouth 2 (two) times daily. 12/31/13   Reuben Likes, MD  ranitidine (ZANTAC) 150 MG tablet Take 1 tablet (150 mg total) by mouth 2 (two) times daily. 08/14/16   Street, [place], PA-C    Family History History  reviewed. No pertinent family history.  Social History Social History   Tobacco Use  . Smoking status: Current Every Day Smoker    Packs/day: 0.50  . Smokeless tobacco: Never Used  Substance Use Topics  . Alcohol use: Yes  . Drug use: No     Allergies   Tylenol [acetaminophen] and Codeine   Review of Systems Review of Systems   Physical Exam Triage Vital Signs ED Triage Vitals [08/16/18 1146]  Enc Vitals Group     BP 126/81     Pulse Rate 91     Resp 18     Temp 99.4 F (37.4 C)     Temp Source Temporal     SpO2 97 %     Weight      Height      Head Circumference      Peak Flow      Pain Score 5     Pain Loc      Pain Edu?      Excl. in GC?    No data found.  Updated Vital Signs BP 126/81 (BP Location: Right Arm)   Pulse 91   Temp 99.4 F (37.4 C) (Temporal)   Resp 18   LMP 02/06/2012   SpO2 97%   Visual Acuity Right Eye Distance:   Left Eye Distance:   Bilateral Distance:    Right Eye Near:   Left Eye Near:    Bilateral Near:     Physical Exam Vitals signs and nursing note reviewed.  Constitutional:      General: She is not in acute distress.    Appearance: Normal appearance. She is well-developed. She is not ill-appearing.  HENT:     Head: Normocephalic and atraumatic.     Right Ear: Tympanic membrane and ear canal normal.     Left Ear: Ear canal normal.     Nose: Congestion present.  Eyes:     Conjunctiva/sclera: Conjunctivae normal.  Neck:     Musculoskeletal: Normal range of motion and neck supple.  Cardiovascular:     Rate and Rhythm: Normal rate and regular rhythm.     Heart sounds: No murmur.  Pulmonary:     Effort: Pulmonary effort is normal. No respiratory distress.     Breath sounds: Normal breath sounds.  Musculoskeletal: Normal range of motion.  Lymphadenopathy:     Cervical: No cervical adenopathy.  Skin:    General: Skin is warm and dry.  Neurological:     Mental Status: She is alert.  Psychiatric:        Mood  and Affect: Mood normal.      UC Treatments / Results  Labs (all labs ordered are listed, but only abnormal results are displayed) Labs Reviewed - No data to display  EKG None  Radiology No results found.  Procedures Procedures (including critical care time)  Medications Ordered in UC Medications - No data to display  Initial Impression / Assessment and Plan / UC Course  I have reviewed the triage vital signs and the nursing notes.  Pertinent labs & imaging results that were available during my care of the patient were reviewed by me and considered in my medical decision making (see chart for details).     Symptoms consistent with viral illness and associated allergies Recommended her using Flonase nasal spray and Zyrtec daily for symptoms Work note given Follow up as needed for continued or worsening symptoms  Final Clinical Impressions(s) / UC Diagnoses   Final diagnoses:  Viral illness     Discharge Instructions     Your symptoms are most likely  viral or allergy related.  We will do Flonase nasal spray and Zyrtec daily for your symptoms Work note given    ED Prescriptions    Medication Sig Dispense Auth. Provider   fluticasone (FLONASE) 50 MCG/ACT nasal spray Place 1 spray into both nostrils daily. 16 g Shametra Cumberland A, NP   cetirizine (ZYRTEC) 10 MG tablet Take 1 tablet (10 mg total) by mouth daily. 30 tablet Dahlia Byes A, NP     Controlled Substance Prescriptions [place] Controlled Substance Registry consulted? Not Applicable   Janace Aris, NP 08/16/18 1349

## 2019-02-06 ENCOUNTER — Other Ambulatory Visit: Payer: Self-pay

## 2019-02-06 DIAGNOSIS — Z20822 Contact with and (suspected) exposure to covid-19: Secondary | ICD-10-CM

## 2019-02-07 ENCOUNTER — Telehealth: Payer: Self-pay | Admitting: Hematology

## 2019-02-07 LAB — NOVEL CORONAVIRUS, NAA: SARS-CoV-2, NAA: NOT DETECTED

## 2019-02-07 NOTE — Telephone Encounter (Signed)
Pt is aware covid 19 test is negative °

## 2020-05-25 ENCOUNTER — Encounter (HOSPITAL_COMMUNITY): Payer: Self-pay | Admitting: Emergency Medicine

## 2020-05-25 ENCOUNTER — Other Ambulatory Visit: Payer: Self-pay

## 2020-05-25 ENCOUNTER — Ambulatory Visit (HOSPITAL_COMMUNITY)
Admission: EM | Admit: 2020-05-25 | Discharge: 2020-05-25 | Disposition: A | Discharge status: Home / Self Care | Payer: 59 | Attending: Family Medicine | Admitting: Family Medicine

## 2020-05-25 DIAGNOSIS — R5383 Other fatigue: Secondary | ICD-10-CM

## 2020-05-25 DIAGNOSIS — U071 COVID-19: Secondary | ICD-10-CM | POA: Diagnosis not present

## 2020-05-25 DIAGNOSIS — H66003 Acute suppurative otitis media without spontaneous rupture of ear drum, bilateral: Secondary | ICD-10-CM | POA: Diagnosis present

## 2020-05-25 DIAGNOSIS — R630 Anorexia: Secondary | ICD-10-CM | POA: Insufficient documentation

## 2020-05-25 MED ORDER — AMOXICILLIN 875 MG PO TABS: 875.0000 mg | ORAL_TABLET | Freq: Two times a day (BID) | ORAL | 0 refills | Status: AC

## 2020-05-25 NOTE — Discharge Instructions (Addendum)
You have been tested for COVID-19 today. °If your test returns positive, you will receive a phone call from [facility] regarding your results. °Negative test results are not called. °Both positive and negative results area always visible on MyChart. °If you do not have a MyChart account, sign up instructions are provided in your discharge papers. °Please do not hesitate to contact us should you have questions or concerns. ° °

## 2020-05-25 NOTE — ED Triage Notes (Addendum)
Diarrhea, fatigue, loss of appetite, and diarrhea started a couple of weeks ago.  Symptoms have been intermittent.  Bilateral ear pain, ears are popping

## 2020-05-26 LAB — SARS CORONAVIRUS 2 (TAT 6-24 HRS): SARS Coronavirus 2: POSITIVE — AB

## 2020-05-27 NOTE — ED Provider Notes (Signed)
  [facility]   [id number] 05/25/20 Arrival Time: 1456  ASSESSMENT & PLAN:  1. Other fatigue   2. Decreased appetite   3. Non-recurrent acute suppurative otitis media of both ears without spontaneous rupture of tympanic membranes     COVID testing sent.  Begin: Meds ordered this encounter  Medications  . amoxicillin (AMOXIL) 875 MG tablet    Sig: Take 1 tablet (875 mg total) by mouth 2 (two) times daily for 10 days.    Dispense:  20 tablet    Refill:  0     Follow-up Information    [facility].   Specialty: Urgent Care Why: As needed. Contact information: [address] [place] [address] [phone]              Reviewed expectations re: course of current medical issues. Questions answered. Outlined signs and symptoms indicating need for more acute intervention. Understanding verbalized. After Visit Summary given.   SUBJECTIVE: History from: patient. Charlotte Green is a 55 y.o. female who presents with worries regarding COVID-19. Known COVID-19 contact: none. Recent travel: none. Reports: bilateral ear pain; decreased appetite. Denies: fever. Normal PO intake without n/v/d.    OBJECTIVE:  Vitals:   05/25/20 1654  BP: 124/62  Pulse: 73  Resp: 20  Temp: 98.9 F (37.2 C)  TempSrc: Oral  SpO2: 100%    General appearance: alert; no distress Eyes: PERRLA; EOMI; conjunctiva normal HENT: Burden; AT; with nasal congestion; both TMs with erythema and bulging Neck: supple  Lungs: speaks full sentences without difficulty; unlabored; dry cough Extremities: no edema Skin: warm and dry Neurologic: normal gait Psychological: alert and cooperative; normal mood and affect   Allergies  Allergen Reactions  . Tylenol [Acetaminophen] Shortness Of Breath and Swelling  . Codeine Other (See Comments)    Reaction: unknown    Past Medical History:  Diagnosis Date  . Hearing loss    lt p hit in head 10/11  . No  pertinent past medical history    Social History   Socioeconomic History  . Marital status: Single    Spouse name: Not on file  . Number of children: Not on file  . Years of education: Not on file  . Highest education level: Not on file  Occupational History  . Not on file  Tobacco Use  . Smoking status: Current Every Day Smoker    Packs/day: 0.50  . Smokeless tobacco: Never Used  Substance and Sexual Activity  . Alcohol use: Yes  . Drug use: No  . Sexual activity: Not on file  Other Topics Concern  . Not on file  Social History Narrative  . Not on file   Social Determinants of Health   Financial Resource Strain: Not on file  Food Insecurity: Not on file  Transportation Needs: Not on file  Physical Activity: Not on file  Stress: Not on file  Social Connections: Not on file  Intimate Partner Violence: Not on file   History reviewed. No pertinent family history. Past Surgical History:  Procedure Laterality Date  . CARPAL TUNNEL RELEASE  2008   lt  . CERVICAL DISC SURGERY  2008  . CERVICAL FUSION  2008  . TYMPANOPLASTY  06/05/2011   Procedure: TYMPANOPLASTY;  Surgeon: Drema Halon, MD;  Location: [facility];  Service: ENT;  Laterality: Left;  with ossicular reconstruction w/ Cartilage Graft     Mardella Layman, MD 05/27/20 [phone]

## 2020-08-18 ENCOUNTER — Emergency Department (HOSPITAL_COMMUNITY)
Admission: EM | Admit: 2020-08-18 | Discharge: 2020-08-19 | Disposition: A | Discharge status: Home / Self Care | Payer: 59 | Attending: Emergency Medicine | Admitting: Emergency Medicine

## 2020-08-18 ENCOUNTER — Encounter (HOSPITAL_COMMUNITY): Payer: Self-pay

## 2020-08-18 ENCOUNTER — Emergency Department (HOSPITAL_COMMUNITY): Payer: 59

## 2020-08-18 ENCOUNTER — Other Ambulatory Visit: Payer: Self-pay

## 2020-08-18 DIAGNOSIS — M25361 Other instability, right knee: Secondary | ICD-10-CM | POA: Insufficient documentation

## 2020-08-18 DIAGNOSIS — F17210 Nicotine dependence, cigarettes, uncomplicated: Secondary | ICD-10-CM | POA: Insufficient documentation

## 2020-08-18 DIAGNOSIS — M25561 Pain in right knee: Secondary | ICD-10-CM | POA: Diagnosis present

## 2020-08-18 MED ORDER — KETOROLAC TROMETHAMINE 60 MG/2ML IM SOLN
60.0000 mg | Freq: Once | INTRAMUSCULAR | Status: AC
  Administered 2020-08-18: 60 mg via INTRAMUSCULAR
  Filled 2020-08-18: qty 2

## 2020-08-18 NOTE — ED Notes (Signed)
Pt via wheelchair to room 02. Vitals taken. A&Ox4. 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 Right knee brace in place. Reports taken ibuprofen for pain without relief. 5/10 pain at this time. Respirations regular/unlabored. Connected to BP and pulse ox. Stretcher low, wheels locked call bell within reach. Awaiting MD.

## 2020-08-18 NOTE — ED Triage Notes (Signed)
Pt came in with c/o R knee pain. Pt states that her R knee dislocates often, but she was unable to pop it back in herself this time. Pt states that she fell a week ago and hit her R knee. Bruising noted.

## 2020-08-18 NOTE — Discharge Instructions (Signed)
Your exam here in the ED was reassuring.  Plain films obtained of your knee were without any acute bony abnormalities or dislocation.  I have concern for ligamentous or tendinous injury and suspect that you would benefit from an MRI.  Please continue to wear your knee immobilizer and use crutches.  Weightbearing as tolerated.  I recommend ibuprofen 600 mg 6 hours as needed for pain/inflammation.  Please call the office of Dr. Jena Gauss to schedule appointment for ongoing evaluation and management.  Please also notify your primary care provider of today's ED encounter.  Return to the ED or seek immediate medical attention should you experience any new or worsening symptoms.

## 2020-08-18 NOTE — ED Provider Notes (Addendum)
[facility]-EMERGENCY DEPT Provider Note   CSN: [id number] Arrival date & time: 08/18/20  2150     History Chief Complaint  Patient presents with  . Knee Pain    Charlotte Green is a 55 y.o. female with past medical history significant for injury to her right knee in 2015 who presents the ED accompanied by her husband for right knee pain.  I reviewed patient's medical record and in 2015 she presented to the ED after a twisting injury to her right knee while climbing onto a bed.  At that time, she felt as though her knee felt "stuck" and was complaining of popliteal region pain.  She complained of severe pain and discomfort at that time and given faint pulses peripherally CT angio lower extremity was obtained and negative.  He was placed in the immobilizer and provided crutches and referral to orthopedics for ongoing evaluation and management.  Patient reports that she was sitting at a table at [employer] when she suddenly developed a spasm/pain in her right knee.  She states that since then she has not been ambulatory.  She typically ambulates without difficulty.  Her husband states that she often will have these episodes, most recently 2 to 3 months ago while sleeping in bed.  They state that it usually looks deformed and she will "smack it back in place".  She has an area of ecchymosis on the posterior aspect of her upper leg, just superior to popliteal region.  She states that this is subsequent to a fall that occurred last week while at work.  She states that she did not have any significant discomfort in that area and does not contributed to today's spasm/onset of pain.  She reports that her knee has always been unstable and she never sought follow-up with orthopedics subsequent to her last ED encounter in 2015.  Patient points to immediately inferiolateral to her patella when described her area of discomfort.  She describes it as feeling "locked" as though she cannot  flex or extend her knee significantly.  She admits that she is afraid to attempt ambulation given concern for instability.    She denies any fevers or chills, history of IVDA or other illicit drug use, recent sexually-transmitted infection, progressive swelling or redness/warmth, numbness or weakness, back pain, wounds, or other symptoms/injuries.  HPI     Past Medical History:  Diagnosis Date  . Hearing loss    lt p hit in head 10/11  . No pertinent past medical history     There are no problems to display for this patient.   Past Surgical History:  Procedure Laterality Date  . CARPAL TUNNEL RELEASE  2008   lt  . CERVICAL DISC SURGERY  2008  . CERVICAL FUSION  2008  . TYMPANOPLASTY  06/05/2011   Procedure: TYMPANOPLASTY;  Surgeon: Drema Halon, MD;  Location: [facility];  Service: ENT;  Laterality: Left;  with ossicular reconstruction w/ Cartilage Graft     OB History   No obstetric history on file.     History reviewed. No pertinent family history.  Social History   Tobacco Use  . Smoking status: Current Every Day Smoker    Packs/day: 0.50  . Smokeless tobacco: Never Used  Substance Use Topics  . Alcohol use: Yes  . Drug use: No    Home Medications Prior to Admission medications   Medication Sig Start Date End Date Taking? Authorizing Provider  albuterol (PROVENTIL HFA;VENTOLIN HFA) 108 (  90 BASE) MCG/ACT inhaler Inhale 2 puffs into the lungs 4 (four) times daily. 12/31/13  Yes Reuben Likes, MD  ibuprofen (ADVIL,MOTRIN) 200 MG tablet Take 600 mg by mouth every 6 (six) hours as needed for fever or moderate pain. For pain   Yes [provider]  cetirizine (ZYRTEC) 10 MG tablet Take 1 tablet (10 mg total) by mouth daily. Patient not taking: Reported on 08/18/2020 08/16/18   Janace Aris, NP  chlorpheniramine-HYDROcodone (TUSSIONEX PENNKINETIC ER) 10-8 MG/5ML LQCR Take 5 mLs by mouth every 12 (twelve) hours as needed for cough. Patient  not taking: No sig reported 08/22/13   Schinlever, Santina Evans, PA-C  fluticasone [facility]) 50 MCG/ACT nasal spray Place 1 spray into both nostrils daily. Patient not taking: Reported on 08/18/2020 08/16/18   Dahlia Byes A, NP  ondansetron (ZOFRAN ODT) 4 MG disintegrating tablet Take 1 tablet (4 mg total) by mouth every 8 (eight) hours as needed for nausea or vomiting. Patient not taking: Reported on 08/18/2020 08/14/16   Street, [place], PA-C  ranitidine (ZANTAC) 150 MG tablet Take 1 tablet (150 mg total) by mouth 2 (two) times daily. Patient not taking: Reported on 08/18/2020 12/31/13   Reuben Likes, MD    Allergies    Tylenol [acetaminophen]  Review of Systems   Review of Systems  All other systems reviewed and are negative.   Physical Exam Updated Vital Signs BP 124/84 (BP Location: Right Arm)   Pulse 74   Temp 98 F (36.7 C) (Oral)   Resp 16   Ht 5\' 2"  (1.575 m)   Wt 68 kg   LMP 02/06/2012   SpO2 98%   BMI 27.44 kg/m   Physical Exam Vitals and nursing note reviewed. Exam conducted with a chaperone present.  Constitutional:      Appearance: Normal appearance.  HENT:     Head: Normocephalic and atraumatic.  Eyes:     General: No scleral icterus.    Conjunctiva/sclera: Conjunctivae normal.  Cardiovascular:     Rate and Rhythm: Normal rate.     Pulses: Normal pulses.  Pulmonary:     Effort: Pulmonary effort is normal. No respiratory distress.  Musculoskeletal:     Comments: Right knee: No significant swelling or effusion noted.  No erythema or warmth.  Mild tenderness over area immediately inferior lateral to patella.  Negative varus and valgus stress testing.  Can passively range the knee.  No significant popliteal tenderness or tenderness elsewhere.  Area of ecchymoses noted over distal hamstring medially.  Right ankle: Can plantarflex and dorsiflex with strength intact against resistance.  Can wiggle toes.  Pedal pulse intact.  Capillary refill less than 2 seconds.  No  tibial or fibular tenderness. Right hip: No tenderness.  ROM intact.  Normal.    Skin:    General: Skin is dry.  Neurological:     Mental Status: She is alert.     GCS: GCS eye subscore is 4. GCS verbal subscore is 5. GCS motor subscore is 6.  Psychiatric:        Mood and Affect: Mood normal.        Behavior: Behavior normal.        Thought Content: Thought content normal.     ED Results / Procedures / Treatments   Labs (all labs ordered are listed, but only abnormal results are displayed) Labs Reviewed - No data to display  EKG None  Radiology DG Knee Complete 4 Views Right  Result Date: 08/18/2020 CLINICAL  DATA:  Right knee pain EXAM: RIGHT KNEE - COMPLETE 4+ VIEW COMPARISON:  Radiograph May 02, 2014. FINDINGS: No evidence of fracture, dislocation, or joint effusion. No evidence of arthropathy or other focal bone abnormality. Soft tissues are unremarkable. IMPRESSION: Negative right knee radiographs. Electronically Signed   By: Maudry Mayhew MD   On: 08/18/2020 23:29    Procedures Procedures   Medications Ordered in ED Medications  ketorolac (TORADOL) injection 60 mg (60 mg Intramuscular Given 08/18/20 2246)    ED Course  I have reviewed the triage vital signs and the nursing notes.  Pertinent labs & imaging results that were available during my care of the patient were reviewed by me and considered in my medical decision making (see chart for details).    MDM Rules/Calculators/A&P                          Charlotte Green was evaluated in Emergency Department on 08/18/2020 for the symptoms described in the history of present illness. She was evaluated in the context of the global COVID-19 pandemic, which necessitated consideration that the patient might be at risk for infection with the SARS-CoV-2 virus that causes COVID-19. Institutional protocols and algorithms that pertain to the evaluation of patients at risk for COVID-19 are in a state of rapid change based on  information released by regulatory bodies including the CDC and federal and state organizations. These policies and algorithms were followed during the patient's care in the ED.  I personally reviewed patient's medical chart and all notes from triage and staff during today's encounter. I have also ordered and reviewed all labs and imaging that I felt to be medically necessary in the evaluation of this patient's complaints and with consideration of their physical exam. If needed, translation services were available and utilized.   Patient in the ER company by her husband because she feels as though her knee is catching and is unstable.  She is afraid to walk on it.  She states that she developed a spasm/acute onset pain while sitting at table with her husband earlier today.  She arrives in a knee immobilizer that she had been previously given for similar symptoms.  She never proceeded to see an orthopedist.  Her exam is unremarkable.  No obvious deformity.  Pedal pulse intact and symmetrical contralateral foot.  Soft compartments throughout.  No unilateral extremity swelling concerning for DVT.  Area of ecchymoses over distal hamstring that she attributes to a fall a week ago.  She states that she fell onto her outstretched arms and denies any significant injury to her patella.  She had been ambulatory after the fall without any difficulty.  No effusion, warmth, or erythema concerning for septic or inflammatory arthritis.  Denies high-risk behavior.  Plain films obtained here in the ED are negative.  Discomfort improved with Toradol IM.  We will provide her with crutches and continued knee immobilizer management.  Emphasized the importance of outpatient follow-up with orthopedics.  Concern for ligamentous or tendinous injury and suspect that she would benefit from MRI.  She had this has been an ongoing issue for her for years where it feels like it "catches".  Discussed case with Dr. Rhunette Croft who agrees with  assessment and plan.  ER return precautions discussed.  Patient voices understanding and is agreeable.  Final Clinical Impression(s) / ED Diagnoses Final diagnoses:  Knee instability, right    Rx / DC Orders ED Discharge Orders  None       Lorelee NewGreen, Karlton Maya L, PA-C 08/18/20 2354    Lorelee NewGreen, Linell Shawn L, PA-C 08/18/20 2355    Derwood KaplanNanavati, Ankit, MD 08/29/20 0028

## 2020-08-19 NOTE — ED Notes (Signed)
Pt educated on use of crutches. Pt demonstrated return teach back. No further concerns at this time.

## 2020-09-05 ENCOUNTER — Other Ambulatory Visit: Payer: Self-pay | Admitting: Student

## 2020-09-05 DIAGNOSIS — S83004A Unspecified dislocation of right patella, initial encounter: Secondary | ICD-10-CM

## 2020-09-13 ENCOUNTER — Other Ambulatory Visit: Payer: Self-pay

## 2020-09-13 ENCOUNTER — Ambulatory Visit
Admission: RE | Admit: 2020-09-13 | Discharge: 2020-09-13 | Disposition: A | Discharge status: Home / Self Care | Payer: 59 | Source: Ambulatory Visit | Attending: Student | Admitting: Student

## 2020-09-13 DIAGNOSIS — S83004A Unspecified dislocation of right patella, initial encounter: Secondary | ICD-10-CM

## 2020-09-23 ENCOUNTER — Other Ambulatory Visit (HOSPITAL_COMMUNITY)
Admission: RE | Admit: 2020-09-23 | Discharge: 2020-09-23 | Disposition: A | Discharge status: Home / Self Care | Payer: 59 | Source: Ambulatory Visit | Attending: Orthopaedic Surgery | Admitting: Orthopaedic Surgery

## 2020-09-23 ENCOUNTER — Encounter (HOSPITAL_BASED_OUTPATIENT_CLINIC_OR_DEPARTMENT_OTHER): Payer: Self-pay | Admitting: Orthopaedic Surgery

## 2020-09-23 ENCOUNTER — Other Ambulatory Visit: Payer: Self-pay

## 2020-09-23 DIAGNOSIS — Z20822 Contact with and (suspected) exposure to covid-19: Secondary | ICD-10-CM | POA: Insufficient documentation

## 2020-09-23 DIAGNOSIS — Z01812 Encounter for preprocedural laboratory examination: Secondary | ICD-10-CM | POA: Insufficient documentation

## 2020-09-23 NOTE — Pre-Procedure Instructions (Signed)

## 2020-09-24 LAB — SARS CORONAVIRUS 2 (TAT 6-24 HRS): SARS Coronavirus 2: NEGATIVE

## 2020-09-25 NOTE — H&P (Addendum)
PREOPERATIVE H&P  Chief Complaint: RIGHT KNEE LATERAL MENISCUS TEAR  HPI: Charlotte Green is a 55 y.o. female who is scheduled for, Procedure(s): RIGHT KNEE ARTHROSCOPY WITH LATERAL MENISECTOMY.   Patient has a past medical history significant for hearing loss.   The patient has had issues with her right  knee since a fall at work on 08-13-20.   She reportedly had some issues with knee instability prior to that but this is an acute traumatic event while at work and is clearly different at this point.  She had X-rays in the ER and was seen by Dr. Caryn Bee Haddix.  She has been in a knee immobilizer and had had catching and grabbing in her knee. She had a sudden acute pain in her  knee which is unable to be tolerated when it happens. She has been avoiding activities and is unable to work normally due to this.   Symptoms are rated as moderate to severe, and have been worsening.  This is significantly impairing activities of daily living.    Please see clinic note for further details on this patient's care.    She has elected for surgical management.   Past Medical History:  Diagnosis Date  . Hearing loss    lt p hit in head 10/11  . No pertinent past medical history    Past Surgical History:  Procedure Laterality Date  . CARPAL TUNNEL RELEASE  2008   lt  . CERVICAL DISC SURGERY  2008  . CERVICAL FUSION  2008  . TYMPANOPLASTY  06/05/2011   Procedure: TYMPANOPLASTY;  Surgeon: Drema Halon, MD;  Location: [facility];  Service: ENT;  Laterality: Left;  with ossicular reconstruction w/ Cartilage Graft   Social History   Socioeconomic History  . Marital status: Single    Spouse name: Not on file  . Number of children: Not on file  . Years of education: Not on file  . Highest education level: Not on file  Occupational History  . Not on file  Tobacco Use  . Smoking status: Current Every Day Smoker    Packs/day: 1.00  . Smokeless tobacco: Never Used   Substance and Sexual Activity  . Alcohol use: Yes  . Drug use: No  . Sexual activity: Not on file  Other Topics Concern  . Not on file  Social History Narrative  . Not on file   Social Determinants of Health   Financial Resource Strain: Not on file  Food Insecurity: Not on file  Transportation Needs: Not on file  Physical Activity: Not on file  Stress: Not on file  Social Connections: Not on file   History reviewed. No pertinent family history. Allergies  Allergen Reactions  . Tylenol [Acetaminophen] Shortness Of Breath and Swelling   Prior to Admission medications   Medication Sig Start Date End Date Taking? Authorizing Provider  fluticasone (FLONASE) 50 MCG/ACT nasal spray Place 1 spray into both nostrils daily. 08/16/18  Yes Bast, Traci A, NP  albuterol (PROVENTIL HFA;VENTOLIN HFA) 108 (90 BASE) MCG/ACT inhaler Inhale 2 puffs into the lungs 4 (four) times daily. 12/31/13   Reuben Likes, MD    ROS: All other systems have been reviewed and were otherwise negative with the exception of those mentioned in the HPI and as above.  Physical Exam: General: Alert, no acute distress Cardiovascular: No pedal edema Respiratory: No cyanosis, no use of accessory musculature GI: No organomegaly, abdomen is soft and non-tender Skin: No lesions  in the area of chief complaint Neurologic: Sensation intact distally Psychiatric: Patient is competent for consent with normal mood and affect Lymphatic: No axillary or cervical lymphadenopathy  MUSCULOSKELETAL:  On examination the right knee demonstrates  an extremely limited examination due to patient discomfort  and cooperation. She has a 10 degree flexion contracture.  She can flex her knee to about 20 degrees and is limited after that due to pain. Tender to palpation in the medial and lateral joint line.  Imaging: MRI  and X-rays reviewed.  X-rays show no acute osseous abnormalities. MRI shows a  discoid meniscus with a free edge tear of  the meniscus on the body, lateral side.  Cartilage overall appears to be reasonable.   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  Plan: Plan for Procedure(s): RIGHT KNEE ARTHROSCOPY WITH LATERAL MENISECTOMY  The risks benefits and alternatives were discussed with the patient including but not limited to the risks of nonoperative treatment, versus surgical intervention including infection, bleeding, nerve injury,  blood clots, cardiopulmonary complications, morbidity, mortality, among others, and they were willing to proceed.   The patient acknowledged the explanation, agreed to proceed with the plan and consent was signed.   Operative Plan: Right knee arthroscopy with a chondroplasty, possible lysis of adhesions and manipulation under anesthesia, and lateral partial meniscectomy with saucerization Discharge Medications: Celebrex, Oxycodone, Zofran DVT Prophylaxis: Aspirin Physical Therapy: Outpatient PT - appointment Monday, May 9th Special Discharge needs: +/- knee immobilizer   Vernetta Honey, PA-C  09/25/2020 8:00 AM

## 2020-09-26 ENCOUNTER — Other Ambulatory Visit: Payer: Self-pay

## 2020-09-26 ENCOUNTER — Encounter (HOSPITAL_BASED_OUTPATIENT_CLINIC_OR_DEPARTMENT_OTHER): Payer: Self-pay | Admitting: Orthopaedic Surgery

## 2020-09-26 ENCOUNTER — Ambulatory Visit (HOSPITAL_BASED_OUTPATIENT_CLINIC_OR_DEPARTMENT_OTHER): Payer: 59 | Admitting: Anesthesiology

## 2020-09-26 ENCOUNTER — Ambulatory Visit (HOSPITAL_BASED_OUTPATIENT_CLINIC_OR_DEPARTMENT_OTHER)
Admission: RE | Admit: 2020-09-26 | Discharge: 2020-09-26 | Disposition: A | Discharge status: Home / Self Care | Payer: 59 | Attending: Orthopaedic Surgery | Admitting: Orthopaedic Surgery

## 2020-09-26 ENCOUNTER — Encounter (HOSPITAL_BASED_OUTPATIENT_CLINIC_OR_DEPARTMENT_OTHER)
Admission: RE | Disposition: A | Discharge status: Home / Self Care | Payer: Self-pay | Source: Home / Self Care | Attending: Orthopaedic Surgery

## 2020-09-26 DIAGNOSIS — M659 Synovitis and tenosynovitis, unspecified: Secondary | ICD-10-CM | POA: Insufficient documentation

## 2020-09-26 DIAGNOSIS — W19XXXA Unspecified fall, initial encounter: Secondary | ICD-10-CM | POA: Diagnosis not present

## 2020-09-26 DIAGNOSIS — S83281A Other tear of lateral meniscus, current injury, right knee, initial encounter: Secondary | ICD-10-CM | POA: Diagnosis present

## 2020-09-26 DIAGNOSIS — F17210 Nicotine dependence, cigarettes, uncomplicated: Secondary | ICD-10-CM | POA: Diagnosis not present

## 2020-09-26 DIAGNOSIS — Y990 Civilian activity done for income or pay: Secondary | ICD-10-CM | POA: Diagnosis not present

## 2020-09-26 DIAGNOSIS — Z79899 Other long term (current) drug therapy: Secondary | ICD-10-CM | POA: Diagnosis not present

## 2020-09-26 DIAGNOSIS — H9190 Unspecified hearing loss, unspecified ear: Secondary | ICD-10-CM | POA: Insufficient documentation

## 2020-09-26 HISTORY — PX: SYNOVECTOMY: SHX5180

## 2020-09-26 HISTORY — PX: CHONDROPLASTY: SHX5177

## 2020-09-26 HISTORY — PX: KNEE ARTHROSCOPY WITH LATERAL MENISECTOMY: SHX6193

## 2020-09-26 SURGERY — ARTHROSCOPY, KNEE, WITH LATERAL MENISCECTOMY
Anesthesia: General | Site: Knee | Laterality: Right

## 2020-09-26 MED ORDER — METHOCARBAMOL 500 MG PO TABS: 500.0000 mg | ORAL_TABLET | Freq: Three times a day (TID) | ORAL | 0 refills | Status: DC | PRN

## 2020-09-26 MED ORDER — LIDOCAINE 2% (20 MG/ML) 5 ML SYRINGE
INTRAMUSCULAR | Status: DC | PRN
  Administered 2020-09-26: 60 mg via INTRAVENOUS

## 2020-09-26 MED ORDER — SODIUM CHLORIDE 0.9 % IR SOLN
Status: DC | PRN
  Administered 2020-09-26: 6000 mL

## 2020-09-26 MED ORDER — OXYCODONE HCL 5 MG PO TABS: 5.0000 mg | ORAL_TABLET | Freq: Once | ORAL | Status: DC | PRN

## 2020-09-26 MED ORDER — BUPIVACAINE HCL (PF) 0.25 % IJ SOLN
INTRAMUSCULAR | Status: DC | PRN
  Administered 2020-09-26: 20 mL

## 2020-09-26 MED ORDER — OXYCODONE HCL 5 MG PO TABS: ORAL_TABLET | ORAL | 0 refills | Status: AC

## 2020-09-26 MED ORDER — FENTANYL CITRATE (PF) 100 MCG/2ML IJ SOLN
INTRAMUSCULAR | Status: DC | PRN
  Administered 2020-09-26 (×3): 50 ug via INTRAVENOUS

## 2020-09-26 MED ORDER — KETOROLAC TROMETHAMINE 30 MG/ML IJ SOLN
30.0000 mg | Freq: Once | INTRAMUSCULAR | Status: AC
  Administered 2020-09-26: 30 mg via INTRAVENOUS

## 2020-09-26 MED ORDER — DEXAMETHASONE SODIUM PHOSPHATE 10 MG/ML IJ SOLN
INTRAMUSCULAR | Status: DC | PRN
  Administered 2020-09-26: 4 mg via INTRAVENOUS

## 2020-09-26 MED ORDER — ONDANSETRON HCL 4 MG PO TABS: 4.0000 mg | ORAL_TABLET | Freq: Three times a day (TID) | ORAL | 0 refills | Status: AC | PRN

## 2020-09-26 MED ORDER — CEFAZOLIN SODIUM-DEXTROSE 2-4 GM/100ML-% IV SOLN
INTRAVENOUS | Status: AC
  Filled 2020-09-26: qty 100

## 2020-09-26 MED ORDER — OXYCODONE HCL 5 MG/5ML PO SOLN: 5.0000 mg | Freq: Once | ORAL | Status: DC | PRN

## 2020-09-26 MED ORDER — CEFAZOLIN SODIUM-DEXTROSE 2-4 GM/100ML-% IV SOLN
2.0000 g | INTRAVENOUS | Status: AC
  Administered 2020-09-26: 2 g via INTRAVENOUS

## 2020-09-26 MED ORDER — CELECOXIB 200 MG PO CAPS: 200.0000 mg | ORAL_CAPSULE | Freq: Two times a day (BID) | ORAL | 0 refills | Status: AC

## 2020-09-26 MED ORDER — MIDAZOLAM HCL 5 MG/5ML IJ SOLN
INTRAMUSCULAR | Status: DC | PRN
  Administered 2020-09-26: 2 mg via INTRAVENOUS

## 2020-09-26 MED ORDER — MIDAZOLAM HCL 2 MG/2ML IJ SOLN
INTRAMUSCULAR | Status: AC
  Filled 2020-09-26: qty 2

## 2020-09-26 MED ORDER — FENTANYL CITRATE (PF) 100 MCG/2ML IJ SOLN
INTRAMUSCULAR | Status: AC
  Filled 2020-09-26: qty 2

## 2020-09-26 MED ORDER — PROPOFOL 500 MG/50ML IV EMUL
INTRAVENOUS | Status: DC | PRN
  Administered 2020-09-26: 25 ug/kg/min via INTRAVENOUS

## 2020-09-26 MED ORDER — LACTATED RINGERS IV SOLN: INTRAVENOUS | Status: DC

## 2020-09-26 MED ORDER — KETOROLAC TROMETHAMINE 30 MG/ML IJ SOLN
INTRAMUSCULAR | Status: AC
  Filled 2020-09-26: qty 1

## 2020-09-26 MED ORDER — FENTANYL CITRATE (PF) 100 MCG/2ML IJ SOLN
25.0000 ug | INTRAMUSCULAR | Status: DC | PRN
  Administered 2020-09-26 (×3): 50 ug via INTRAVENOUS

## 2020-09-26 MED ORDER — SODIUM CHLORIDE 0.9 % IR SOLN
Status: DC | PRN
  Administered 2020-09-26: 3000 mL

## 2020-09-26 MED ORDER — PROPOFOL 10 MG/ML IV BOLUS
INTRAVENOUS | Status: DC | PRN
  Administered 2020-09-26: 150 mg via INTRAVENOUS

## 2020-09-26 MED ORDER — ONDANSETRON HCL 4 MG/2ML IJ SOLN
INTRAMUSCULAR | Status: DC | PRN
  Administered 2020-09-26: 4 mg via INTRAVENOUS

## 2020-09-26 MED ORDER — BUPIVACAINE HCL (PF) 0.25 % IJ SOLN
INTRAMUSCULAR | Status: AC
  Filled 2020-09-26: qty 30

## 2020-09-26 MED ORDER — ASPIRIN 81 MG PO CHEW: 81.0000 mg | CHEWABLE_TABLET | Freq: Two times a day (BID) | ORAL | 0 refills | Status: DC

## 2020-09-26 MED ORDER — SULFAMETHOXAZOLE-TRIMETHOPRIM 800-160 MG PO TABS: 1.0000 | ORAL_TABLET | Freq: Two times a day (BID) | ORAL | 0 refills | Status: AC

## 2020-09-26 SURGICAL SUPPLY — 38 items
APL PRP STRL LF DISP 70% ISPRP (MISCELLANEOUS) ×1
BANDAGE ESMARK 6X9 LF (GAUZE/BANDAGES/DRESSINGS) IMPLANT
BLADE CLIPPER SURG (BLADE) IMPLANT
BNDG CMPR 9X6 STRL LF SNTH (GAUZE/BANDAGES/DRESSINGS)
BNDG ELASTIC 6X5.8 VLCR STR LF (GAUZE/BANDAGES/DRESSINGS) ×2 IMPLANT
BNDG ESMARK 6X9 LF (GAUZE/BANDAGES/DRESSINGS)
CHLORAPREP W/TINT 26 (MISCELLANEOUS) ×2 IMPLANT
CLSR STERI-STRIP ANTIMIC 1/2X4 (GAUZE/BANDAGES/DRESSINGS) ×2 IMPLANT
CUFF TOURN SGL QUICK 34 (TOURNIQUET CUFF) ×2
CUFF TRNQT CYL 34X4.125X (TOURNIQUET CUFF) ×1 IMPLANT
DISSECTOR 3.5MM X 13CM CVD (MISCELLANEOUS) IMPLANT
DISSECTOR 4.0MMX13CM CVD (MISCELLANEOUS) ×2 IMPLANT
DRAPE ARTHROSCOPY W/POUCH 90 (DRAPES) ×2 IMPLANT
DRAPE IMP U-DRAPE 54X76 (DRAPES) ×2 IMPLANT
DRAPE U-SHAPE 47X51 STRL (DRAPES) ×2 IMPLANT
GAUZE SPONGE 4X4 12PLY STRL (GAUZE/BANDAGES/DRESSINGS) ×2 IMPLANT
GLOVE SRG 8 PF TXTR STRL LF DI (GLOVE) ×1 IMPLANT
GLOVE SURG ENC MOIS LTX SZ6.5 (GLOVE) ×2 IMPLANT
GLOVE SURG LTX SZ8 (GLOVE) ×4 IMPLANT
GLOVE SURG UNDER POLY LF SZ6.5 (GLOVE) ×2 IMPLANT
GLOVE SURG UNDER POLY LF SZ8 (GLOVE) ×2
GOWN STRL REUS W/ TWL LRG LVL3 (GOWN DISPOSABLE) ×1 IMPLANT
GOWN STRL REUS W/TWL LRG LVL3 (GOWN DISPOSABLE) ×2
GOWN STRL REUS W/TWL XL LVL3 (GOWN DISPOSABLE) ×2 IMPLANT
KIT TURNOVER KIT B (KITS) ×2 IMPLANT
MANIFOLD NEPTUNE II (INSTRUMENTS) IMPLANT
NDL SAFETY ECLIPSE 18X1.5 (NEEDLE) ×1 IMPLANT
NEEDLE HYPO 18GX1.5 SHARP (NEEDLE) ×2
NS IRRIG 1000ML POUR BTL (IV SOLUTION) IMPLANT
PACK ARTHROSCOPY DSU (CUSTOM PROCEDURE TRAY) ×2 IMPLANT
PORT APPOLLO RF 90DEGREE MULTI (SURGICAL WAND) IMPLANT
SLEEVE SCD COMPRESS KNEE MED (STOCKING) ×2 IMPLANT
SUT MNCRL AB 4-0 PS2 18 (SUTURE) ×2 IMPLANT
SYR 5ML LUER SLIP (SYRINGE) ×2 IMPLANT
TOWEL GREEN STERILE FF (TOWEL DISPOSABLE) ×2 IMPLANT
TUBE CONNECTING 20X1/4 (TUBING) ×2 IMPLANT
TUBING ARTHROSCOPY IRRIG 16FT (MISCELLANEOUS) ×2 IMPLANT
WATER STERILE IRR 1000ML POUR (IV SOLUTION) ×2 IMPLANT

## 2020-09-26 NOTE — Anesthesia Postprocedure Evaluation (Signed)
Anesthesia Post Note  Patient: Charlotte Green  Procedure(s) Performed: RIGHT KNEE ARTHROSCOPY WITH LATERAL MENISECTOMY; DEBRIDEMENT (Right Knee) CHONDROPLASTY (Right Knee) SYNOVECTOMY; SYNOVIAL BIOPSY (Right Knee)     Patient location during evaluation: PACU Anesthesia Type: General Level of consciousness: awake and alert Pain management: pain level controlled Vital Signs Assessment: post-procedure vital signs reviewed and stable Respiratory status: spontaneous breathing, nonlabored ventilation and respiratory function stable Cardiovascular status: blood pressure returned to baseline and stable Postop Assessment: no apparent nausea or vomiting Anesthetic complications: no   No complications documented.  Last Vitals:  Vitals:   09/26/20 1309 09/26/20 1319  BP: 107/61 103/81  Pulse:  85  Resp:    Temp:  36.7 C  SpO2:  99%    Last Pain:  Vitals:   09/26/20 1319  TempSrc:   PainSc: 3                  Lowella Curb

## 2020-09-26 NOTE — Anesthesia Preprocedure Evaluation (Addendum)
Anesthesia Evaluation  Patient identified by MRN, date of birth, ID band Patient awake    Reviewed: Allergy & Precautions, H&P , NPO status , Patient's Chart, lab work & pertinent test results, reviewed documented beta blocker date and time   History of Anesthesia Complications Negative for: history of anesthetic complications  Airway Mallampati: I  TM Distance: >3 FB Neck ROM: full    Dental  (+) Dental Advisory Given, Teeth Intact   Pulmonary neg shortness of breath, neg sleep apnea, neg COPD, neg recent URI, Current SmokerPatient did not abstain from smoking.,  Covid-19 Nucleic Acid Test Results Lab Results      Component                Value               Date                      SARSCOV2NAA              NEGATIVE            09/23/2020                SARSCOV2NAA              POSITIVE (A)        05/25/2020                SARSCOV2NAA              Not Detected        02/06/2019              breath sounds clear to auscultation       Cardiovascular negative cardio ROS   Rhythm:Regular Rate:Normal     Neuro/Psych negative neurological ROS  negative psych ROS   GI/Hepatic negative GI ROS, Neg liver ROS,   Endo/Other  negative endocrine ROS  Renal/GU negative Renal ROS     Musculoskeletal RIGHT KNEE LATERAL MENISCUS TEAR   Abdominal   Peds  Hematology negative hematology ROS (+)   Anesthesia Other Findings See surgeon's H&P   Reproductive/Obstetrics                            Anesthesia Physical  Anesthesia Plan  ASA: II  Anesthesia Plan: General   Post-op Pain Management:    Induction: Intravenous  PONV Risk Score and Plan: 2 and Ondansetron, Midazolam and Treatment may vary due to age or medical condition  Airway Management Planned: LMA  Additional Equipment: None  Intra-op Plan:   Post-operative Plan: Extubation in OR  Informed Consent: I have reviewed the patients  History and Physical, chart, labs and discussed the procedure including the risks, benefits and alternatives for the proposed anesthesia with the patient or authorized representative who has indicated his/her understanding and acceptance.     Dental advisory given  Plan Discussed with: CRNA and Surgeon  Anesthesia Plan Comments:        Anesthesia Quick Evaluation

## 2020-09-26 NOTE — Interval H&P Note (Signed)
History and Physical Interval Note:  09/26/2020 10:19 AM  Charlotte Green  has presented today for surgery, with the diagnosis of RIGHT KNEE LATERAL MENISCUS TEAR.  The various methods of treatment have been discussed with the patient and family. After consideration of risks, benefits and other options for treatment, the patient has consented to  Procedure(s): RIGHT KNEE ARTHROSCOPY WITH LATERAL MENISECTOMY (Right) as a surgical intervention.  The patient's history has been reviewed, patient examined, no change in status, stable for surgery.  I have reviewed the patient's chart and labs.  Questions were answered to the patient's satisfaction.     Bjorn Pippin

## 2020-09-26 NOTE — Op Note (Signed)
Orthopaedic Surgery Operative Note (CSN: [id number])  Charlotte Green  03/14/1966 Date of Surgery: 09/26/2020   Diagnoses:  Right knee lateral meniscus tear  Procedure: Right knee arthroscopy with partial lateral meniscectomy Right knee chondroplasty Right knee extensive synovectomy with synovial biopsy Right knee irrigation debridement   Operative Finding Exam under anesthesia: Patient had motion from 5 to 90 degrees preoperatively with relatively firm endpoints, there was no instability of the joint and there was no patellofemoral instability.  After lysis of adhesions manipulation the patient had range of motion from about 3 to 135 degrees of flexion Suprapatellar pouch: Significant synovitis and thickening in the superior pouch, areas of atypical appearing tissue that may have been purulence versus robust synovitis were noted. Patellofemoral Compartment: Cartilage was reasonable however there is surrounding intense synovitis all around the patella Medial Compartment: Anterior fat pad synovitis however the medial compartment was otherwise relatively normal Lateral Compartment: Atypical appearance of meniscus had a chronic appearing tear, no discoid meniscus noted.  30% total meniscal volume taken back.  Synovium resected. Intercondylar Notch: ACL was noted to be intact however it was relatively inflammed  Successful completion of the planned procedure.  Patient had unhealthy appearing tissue that immediately came out of the knee upon opening it.  The material that I was able to see was difficult to assess whether was inflamed synovium versus purulent type material but it did appear more purulent than I would typically expect in a standard knee scope.  The patient's preoperative symptoms were much more intense than I would normally expect for a knee scope as well but the patient had multiple factors that may have led to this.  We will place her on oral antibiotics after this extensive  debridement and wait for cultures.  Post-operative plan: The patient will be discharged home on antibiotics.  The patient will be weightbearing as tolerated.  DVT prophylaxis Aspirin 81 mg twice daily for 6 weeks.  Pain control with PRN pain medication preferring oral medicines.  Follow up plan will be scheduled in approximately 7 days for incision check.  Post-Op Diagnosis: Same Surgeons:Primary: Bjorn Pippin, MD Assistants:Caroline McBane PA-C Location: [facility] OR ROOM 5 Anesthesia: General with local Antibiotics: Ancef 2 g Tourniquet time: * No tourniquets in log * Estimated Blood Loss: Minimal Complications: None Specimens: 2 for culture, right knee 1 and 2 synovium Implants: * No implants in log *  Indications for Surgery:   Charlotte Green is a 55 y.o. female with right lateral meniscus tear and a sense of instability in the knee.  She had been apprehensive about walking on it for quite some time.  She would not allow me to complete a full examination in the office secondary to pain.  She had no obvious swelling at that point.  We discussed that her pain was somewhat out of proportion to her normal findings.  No effusion in clinic or on MRI.  Benefits and risks of operative and nonoperative management were discussed prior to surgery with patient/guardian(s) and informed consent form was completed.  Specific risks including infection, need for additional surgery, continued pain, need for further surgery.   Procedure:   The patient was identified properly. Informed consent was obtained and the surgical site was marked. The patient was taken up to suite where general anesthesia was induced. The patient was placed in the supine position with a post against the surgical leg and a nonsterile tourniquet applied. The surgical leg was then prepped and draped usual sterile  fashion.  A standard surgical timeout was performed.  2 standard anterior portals were made and diagnostic arthroscopy  performed. Please note the findings as noted above.  We began with a standard portals as above.  We entered the knee and immediately upon making her medial portal noted some fibrinous versus purulent material came out.  We then noted inflamed synovium throughout the knee.  We took multiple synovial biopsies and sent these for culture.  We then performed a thorough and careful synovectomy with a shaver and RF ablator.  We performed a lysis of adhesions as well with a RF ablator and shaver.  Once we had appropriately resected the synovium we ensured that there is no excessive bleeding and used a and did not use a tourniquet for this case.  That point we performed a partial lateral meniscectomy using a shaver and a basket back to stable base.  We irrigated with 9 L normal saline the patient tolerated all this well. Incisions closed with absorbable suture. The patient was awoken from general anesthesia and taken to the PACU in stable condition without complication.   Alfonse Alpers, PA-C, present and scrubbed throughout the case, critical for completion in a timely fashion, and for retraction, instrumentation, closure.

## 2020-09-26 NOTE — Anesthesia Procedure Notes (Signed)
Procedure Name: LMA Insertion Date/Time: 09/26/2020 11:34 AM Performed by: Sheryn Bison, CRNA Pre-anesthesia Checklist: Patient identified, Emergency Drugs available, Suction available and Patient being monitored Patient Re-evaluated:Patient Re-evaluated prior to induction Oxygen Delivery Method: Circle System Utilized Preoxygenation: Pre-oxygenation with 100% oxygen Induction Type: IV induction Ventilation: Mask ventilation without difficulty LMA: LMA inserted LMA Size: 4.0 Number of attempts: 1 Airway Equipment and Method: bite block Placement Confirmation: positive ETCO2 Tube secured with: Tape Dental Injury: Teeth and Oropharynx as per pre-operative assessment

## 2020-09-26 NOTE — Discharge Instructions (Signed)
Ramond Marrow MD, MPH Alfonse Alpers, PA-C [facility] [address], [address] [phone] (tel)   [phone] (fax)   POST-OPERATIVE INSTRUCTIONS - Knee Arthroscopy  WOUND CARE - You may remove the Operative Dressing on Post-Op Day #3 (72hrs after surgery).   -  Alternatively if you would like you can leave dressing on until follow-up if within 7-8 days but keep it dry. - Leave steri-strips in place until they fall off on their own, usually 2 weeks postop. - An ACE wrap may be used to control swelling, do not wrap this too tight.  If the initial ACE wrap feels too tight you may loosen it. - There may be a small amount of fluid/bleeding leaking at the surgical site.  - This is normal; the knee is filled with fluid during the procedure and can leak for 24-48hrs after surgery. You may change/reinforce the bandage as needed.  - Use the Cryocuff or Ice as often as possible for the first 7 days, then as needed for pain relief. Always keep a towel, ACE wrap or other barrier between the cooling unit and your skin.  - You may shower on Post-Op Day #3. Gently pat the area dry. Do not soak the knee in water or submerge it.  - Do not go swimming in the pool or ocean until 4 weeks after surgery or when otherwise instructed.  Keep incisions as dry as possible.   BRACE/AMBULATION -  You do not need a brace after this procedure - You may use crutches or a walker to help you ambulate - You can put full weight on your operative leg as you feel comfortable  REGIONAL ANESTHESIA (NERVE BLOCKS) - The anesthesia team may have performed a nerve block for you if safe in the setting of your care.  This is a great tool used to minimize pain.  Typically the block may start wearing off overnight.  This can be a challenging period but please utilize your as needed pain medications to try and manage this period and know it will be a brief transition as the nerve block wears completely    POST-OP MEDICATIONS - Multimodal approach to pain control - In general your pain will be controlled with a combination of substances.  Prescriptions unless otherwise discussed are electronically sent to your pharmacy.  This is a carefully made plan we use to minimize narcotic use.     - Celebrex - Anti-inflammatory medication taken on a scheduled basis   - Take 1 tablet twice a day - Oxycodone - This is a strong narcotic, to be used only on an "as needed" basis for severe pain. - Aspirin 81mg  - This medicine is used to minimize the risk of blood clots after surgery. -  Zofran - take as needed for nausea - Robaxin - this is a muscle relaxer, take as needed for muscle spasms - Bactrim - this is an antibiotic, take as instructed  FOLLOW-UP   Please call the office to schedule a follow-up appointment for your incision check, 7-10 days post-operatively.  IF YOU HAVE ANY QUESTIONS, PLEASE FEEL FREE TO CALL OUR OFFICE.   HELPFUL INFORMATION  - If you had a block, it will wear off between 8-24 hrs postop typically.  This is period when your pain may go from nearly zero to the pain you would have had post-op without the block.  This is an abrupt transition but nothing dangerous is happening.  You may take an extra dose  of narcotic when this happens.   Keep your leg elevated to decrease swelling, which will then in turn decrease your pain. I would elevate the foot of your bed by putting a couple of couch pillows between your mattress and box spring. I would not keep pillow directly under your ankle.  - Do not sleep with a pillow behind your knee even if it is more comfortable as this may make it harder to get your knee fully straight long term.   There will be MORE swelling on days 1-3 than there is on the day of surgery.  This also is normal. The swelling will decrease with the anti-inflammatory medication, ice and keeping it elevated. The swelling will make it more difficult to bend your  knee. As the swelling goes down your motion will become easier   You may develop swelling and bruising that extends from your knee down to your calf and perhaps even to your foot over the next week. Do not be alarmed. This too is normal, and it is due to gravity   There may be some numbness adjacent to the incision site. This may last for 6-12 months or longer in some patients and is expected.   You may return to sedentary work/school in the next couple of days when you feel up to it. You will need to keep your leg elevated as much as possible    You should wean off your narcotic medicines as soon as you are able.  Most patients will be off or using minimal narcotics before their first postop appointment.    We suggest you use the pain medication the first night prior to going to bed, in order to ease any pain when the anesthesia wears off. You should avoid taking pain medications on an empty stomach as it will make you nauseous.   Do not drink alcoholic beverages or take illicit drugs when taking pain medications.   It is against the law to drive while taking narcotics. You cannot drive if your Right leg is in brace locked in extension.   Pain medication may make you constipated.  Below are a few solutions to try in this order:  o Decrease the amount of pain medication if you aren't having pain.  o Drink lots of decaffeinated fluids.  o Drink prune juice and/or eat dried prunes   o If the first 3 don't work start with additional solutions  o Take Colace - an over-the-counter stool softener  o Take Senokot - an over-the-counter laxative  o Take Miralax - a stronger over-the-counter laxative    For more information including helpful videos and documents visit our website:   [email or web address]-information.[email or web address]   May take NSAIDS (Ibuprofen, Motrin) after 7pm, if needed.   Post Anesthesia Home Care Instructions  Activity: Get plenty of rest for the remainder  of the day. A responsible individual must stay with you for 24 hours following the procedure.  For the next 24 hours, DO NOT: -Drive a car -Advertising copywriter -Drink alcoholic beverages -Take any medication unless instructed by your physician -Make any legal decisions or sign important papers.  Meals: Start with liquid foods such as gelatin or soup. Progress to regular foods as tolerated. Avoid greasy, spicy, heavy foods. If nausea and/or vomiting occur, drink only clear liquids until the nausea and/or vomiting subsides. Call your physician if vomiting continues.  Special Instructions/Symptoms: Your throat may feel dry or sore from the anesthesia or the breathing tube placed in your throat  during surgery. If this causes discomfort, gargle with warm salt water. The discomfort should disappear within 24 hours.  If you had a scopolamine patch placed behind your ear for the management of post- operative nausea and/or vomiting:  1. The medication in the patch is effective for 72 hours, after which it should be removed.  Wrap patch in a tissue and discard in the trash. Wash hands thoroughly with soap and water. 2. You may remove the patch earlier than 72 hours if you experience unpleasant side effects which may include dry mouth, dizziness or visual disturbances. 3. Avoid touching the patch. Wash your hands with soap and water after contact with the patch.

## 2020-09-26 NOTE — Transfer of Care (Signed)
Immediate Anesthesia Transfer of Care Note  Patient: KEARIA YIN  Procedure(s) Performed: RIGHT KNEE ARTHROSCOPY WITH LATERAL MENISECTOMY; DEBRIDEMENT (Right Knee) CHONDROPLASTY (Right Knee) SYNOVECTOMY; SYNOVIAL BIOPSY (Right Knee)  Patient Location: PACU  Anesthesia Type:General  Level of Consciousness: awake, alert , oriented and patient cooperative  Airway & Oxygen Therapy: Patient Spontanous Breathing and Patient connected to face mask oxygen  Post-op Assessment: Report given to RN and Post -op Vital signs reviewed and stable  Post vital signs: Reviewed and stable  Last Vitals:  Vitals Value Taken Time  BP 125/101 09/26/20 1220  Temp    Pulse 79 09/26/20 1222  Resp 16 09/26/20 1222  SpO2 100 % 09/26/20 1222  Vitals shown include unvalidated device data.  Last Pain:  Vitals:   09/26/20 1007  TempSrc: Oral  PainSc: 4       Patients Stated Pain Goal: 2 (09/26/20 1007)  Complications: No complications documented.

## 2020-09-27 ENCOUNTER — Encounter (HOSPITAL_BASED_OUTPATIENT_CLINIC_OR_DEPARTMENT_OTHER): Payer: Self-pay | Admitting: Orthopaedic Surgery

## 2020-10-01 LAB — AEROBIC/ANAEROBIC CULTURE W GRAM STAIN (SURGICAL/DEEP WOUND)
Culture: NO GROWTH
Gram Stain: NONE SEEN

## 2021-07-10 ENCOUNTER — Other Ambulatory Visit: Payer: Self-pay

## 2021-07-10 ENCOUNTER — Ambulatory Visit (HOSPITAL_COMMUNITY)
Admission: EM | Admit: 2021-07-10 | Discharge: 2021-07-10 | Disposition: A | Discharge status: Home / Self Care | Payer: BC Managed Care – PPO | Attending: Student | Admitting: Student

## 2021-07-10 ENCOUNTER — Encounter (HOSPITAL_COMMUNITY): Payer: Self-pay

## 2021-07-10 DIAGNOSIS — Z1152 Encounter for screening for COVID-19: Secondary | ICD-10-CM | POA: Insufficient documentation

## 2021-07-10 DIAGNOSIS — F17200 Nicotine dependence, unspecified, uncomplicated: Secondary | ICD-10-CM | POA: Insufficient documentation

## 2021-07-10 DIAGNOSIS — J069 Acute upper respiratory infection, unspecified: Secondary | ICD-10-CM | POA: Diagnosis present

## 2021-07-10 LAB — SARS CORONAVIRUS 2 (TAT 6-24 HRS): SARS Coronavirus 2: POSITIVE — AB

## 2021-07-10 MED ORDER — AMOXICILLIN-POT CLAVULANATE 875-125 MG PO TABS: 1.0000 | ORAL_TABLET | Freq: Two times a day (BID) | ORAL | 0 refills | Status: DC

## 2021-07-10 MED ORDER — ALBUTEROL SULFATE HFA 108 (90 BASE) MCG/ACT IN AERS: 1.0000 | INHALATION_SPRAY | Freq: Four times a day (QID) | RESPIRATORY_TRACT | 0 refills | Status: AC | PRN

## 2021-07-10 NOTE — ED Provider Notes (Signed)
[place]    CSN: [id number] Arrival date & time: 07/10/21  0950      History   Chief Complaint Chief Complaint  Patient presents with   Cough    HPI Charlotte Green is a 56 y.o. female  presenting with viral syndrome x3 days. Medical history current smoker, denies formal diagnosis of of COPD. Generalized myalgias, cough productive of green and yellow sputum, sore throat, bilateral ear pressure and drainage. Denies increase in baseline SOB. Denies CP, dizziness, weakness. Some lightheadedness with standing but none currently. States she caught this from her son who was exposed to covid. Attempted dayquil with some relief. Has not used her albuterol inhaler at all as it is expired.   HPI  Past Medical History:  Diagnosis Date   Hearing loss    lt p hit in head 10/11   No pertinent past medical history     There are no problems to display for this patient.   Past Surgical History:  Procedure Laterality Date   CARPAL TUNNEL RELEASE  2008   lt   CERVICAL [place] SURGERY  2008   CERVICAL FUSION  2008   CHONDROPLASTY Right 09/26/2020   Procedure: CHONDROPLASTY;  Surgeon: Hiram Gash, MD;  Location: [facility];  Service: Orthopedics;  Laterality: Right;   KNEE ARTHROSCOPY WITH LATERAL MENISECTOMY Right 09/26/2020   Procedure: RIGHT KNEE ARTHROSCOPY WITH LATERAL MENISECTOMY; DEBRIDEMENT;  Surgeon: Hiram Gash, MD;  Location: [facility];  Service: Orthopedics;  Laterality: Right;   SYNOVECTOMY Right 09/26/2020   Procedure: SYNOVECTOMY; SYNOVIAL BIOPSY;  Surgeon: Hiram Gash, MD;  Location: [facility];  Service: Orthopedics;  Laterality: Right;   TYMPANOPLASTY  06/05/2011   Procedure: TYMPANOPLASTY;  Surgeon: Rozetta Nunnery, MD;  Location: [facility];  Service: ENT;  Laterality: Left;  with ossicular reconstruction w/ Cartilage Graft    OB History   No obstetric history on file.      Home  Medications    Prior to Admission medications   Medication Sig Start Date End Date Taking? Authorizing Provider  albuterol (VENTOLIN HFA) 108 (90 Base) MCG/ACT inhaler Inhale 1-2 puffs into the lungs every 6 (six) hours as needed for wheezing or shortness of breath. 07/10/21  Yes Hazel Sams, PA-C  amoxicillin-clavulanate (AUGMENTIN) 875-125 MG tablet Take 1 tablet by mouth every 12 (twelve) hours. 07/10/21  Yes Hazel Sams, PA-C  aspirin (ASPIRIN CHILDRENS) 81 MG chewable tablet Chew 1 tablet (81 mg total) by mouth 2 (two) times daily. For DVT prophylaxis after surgery 09/26/20   Ethelda Chick, PA-C  fluticasone [facility]) 50 MCG/ACT nasal spray Place 1 spray into both nostrils daily. 08/16/18   Loura Halt A, NP  methocarbamol (ROBAXIN) 500 MG tablet Take 1 tablet (500 mg total) by mouth every 8 (eight) hours as needed for muscle spasms. 09/26/20   Ethelda Chick, PA-C    Family History History reviewed. No pertinent family history.  Social History Social History   Tobacco Use   Smoking status: Every Day    Packs/day: 1.00    Types: Cigarettes   Smokeless tobacco: Never  Substance Use Topics   Alcohol use: Yes   Drug use: No     Allergies   Tylenol [acetaminophen]   Review of Systems Review of Systems  Constitutional:  Negative for appetite change, chills and fever.  HENT:  Positive for congestion. Negative for ear pain, rhinorrhea, sinus pressure, sinus pain  and sore throat.   Eyes:  Negative for redness and visual disturbance.  Respiratory:  Positive for cough. Negative for chest tightness, shortness of breath and wheezing.   Cardiovascular:  Negative for chest pain and palpitations.  Gastrointestinal:  Negative for abdominal pain, constipation, diarrhea, nausea and vomiting.  Genitourinary:  Negative for dysuria, frequency and urgency.  Musculoskeletal:  Negative for myalgias.  Neurological:  Negative for dizziness, weakness and headaches.   Psychiatric/Behavioral:  Negative for confusion.   All other systems reviewed and are negative.   Physical Exam Triage Vital Signs ED Triage Vitals [07/10/21 1124]  Enc Vitals Group     BP (!) 147/84     Pulse Rate 66     Resp 18     Temp 98.4 F (36.9 C)     Temp Source Oral     SpO2 96 %     Weight      Height      Head Circumference      Peak Flow      Pain Score 6     Pain Loc      Pain Edu?      Excl. in [place]?    No data found.  Updated Vital Signs BP (!) 147/84 (BP Location: Left Arm)    Pulse 66    Temp 98.4 F (36.9 C) (Oral)    Resp 18    LMP 02/06/2012    SpO2 96%   Visual Acuity Right Eye Distance:   Left Eye Distance:   Bilateral Distance:    Right Eye Near:   Left Eye Near:    Bilateral Near:     Physical Exam Vitals reviewed.  Constitutional:      General: She is not in acute distress.    Appearance: Normal appearance. She is not ill-appearing.  HENT:     Head: Normocephalic and atraumatic.     Right Ear: Tympanic membrane, ear canal and external ear normal. No tenderness. No middle ear effusion. There is no impacted cerumen. Tympanic membrane is not perforated, erythematous, retracted or bulging.     Left Ear: Tympanic membrane, ear canal and external ear normal. No tenderness.  No middle ear effusion. There is no impacted cerumen. Tympanic membrane is not perforated, erythematous, retracted or bulging.     Nose: Nose normal. No congestion.     Mouth/Throat:     Mouth: Mucous membranes are moist.     Pharynx: Uvula midline. No oropharyngeal exudate or posterior oropharyngeal erythema.  Eyes:     Extraocular Movements: Extraocular movements intact.     Pupils: Pupils are equal, round, and reactive to light.  Cardiovascular:     Rate and Rhythm: Normal rate and regular rhythm.     Heart sounds: Normal heart sounds.  Pulmonary:     Effort: Pulmonary effort is normal.     Breath sounds: Wheezing present. No decreased breath sounds, rhonchi or  rales.     Comments: Wheezes throughout  Abdominal:     Palpations: Abdomen is soft.     Tenderness: There is no abdominal tenderness. There is no guarding or rebound.  Lymphadenopathy:     Cervical: No cervical adenopathy.     Right cervical: No superficial cervical adenopathy.    Left cervical: No superficial cervical adenopathy.  Neurological:     General: No focal deficit present.     Mental Status: She is alert and oriented to person, place, and time.  Psychiatric:  Mood and Affect: Mood normal.        Behavior: Behavior normal.        Thought Content: Thought content normal.        Judgment: Judgment normal.     UC Treatments / Results  Labs (all labs ordered are listed, but only abnormal results are displayed) Labs Reviewed  SARS CORONAVIRUS 2 (TAT 6-24 HRS)    EKG   Radiology No results found.  Procedures Procedures (including critical care time)  Medications Ordered in UC Medications - No data to display  Initial Impression / Assessment and Plan / UC Course  I have reviewed the triage vital signs and the nursing notes.  Pertinent labs & imaging results that were available during my care of the patient were reviewed by me and considered in my medical decision making (see chart for details).     This patient is a very pleasant 56 y.o. year old female presenting with viral syndrome. Today this pt is afebrile nontachycardic nontachypneic, oxygenating well on room air, wheezes throughout but no rhonchi or rales.  No formal diagnosis of COPD, but she is a current every day smoker for years.  Given increase in purulent sputum, will manage as COPD exacerbation with Augmentin and albuterol inhaler.  COVID PCR sent. She would be a candidate for antiviral if this is positive.  ED return precautions discussed. Patient verbalizes understanding and agreement.   Coding Level 4 for acute exacerbation of chronic condition, and prescription drug management   Final  Clinical Impressions(s) / UC Diagnoses   Final diagnoses:  Viral URI with cough  Current smoker  Encounter for screening for COVID-19     Discharge Instructions      -Start the antibiotic-Augmentin (amoxicillin-clavulanate), 1 pill every 12 hours for 7 days.  You can take this with food like with breakfast and dinner. -Albuterol inhaler as needed for cough, wheezing, shortness of breath, 1 to 2 puffs every 6 hours as needed. -With a virus, you're typically contagious for 5-7 days, or as long as you're having fevers.  -Follow-up if symptoms worsen - shortness of breath, new chest pain, fevers getting worse, etc.      ED Prescriptions     Medication Sig Dispense Auth. Provider   amoxicillin-clavulanate (AUGMENTIN) 875-125 MG tablet Take 1 tablet by mouth every 12 (twelve) hours. 14 tablet Hazel Sams, PA-C   albuterol (VENTOLIN HFA) 108 (90 Base) MCG/ACT inhaler Inhale 1-2 puffs into the lungs every 6 (six) hours as needed for wheezing or shortness of breath. 1 each Hazel Sams, PA-C      PDMP not reviewed this encounter.   Hazel Sams, PA-C 07/10/21 1213

## 2021-07-10 NOTE — Discharge Instructions (Addendum)
-  Start the antibiotic-Augmentin (amoxicillin-clavulanate), 1 pill every 12 hours for 7 days.  You can take this with food like with breakfast and dinner. -Albuterol inhaler as needed for cough, wheezing, shortness of breath, 1 to 2 puffs every 6 hours as needed. -With a virus, you're typically contagious for 5-7 days, or as long as you're having fevers.  -Follow-up if symptoms worsen - shortness of breath, new chest pain, fevers getting worse, etc.

## 2021-07-10 NOTE — ED Triage Notes (Signed)
Pt c/o cough, congestion, chills, fever, headache, bilateral ear pain, and dizziness since Tuesday. Took dayquil this am.

## 2021-07-11 ENCOUNTER — Telehealth (HOSPITAL_COMMUNITY): Payer: Self-pay | Admitting: Emergency Medicine

## 2021-07-11 MED ORDER — MOLNUPIRAVIR EUA 200MG CAPSULE: 4.0000 | ORAL_CAPSULE | Freq: Two times a day (BID) | ORAL | 0 refills | Status: AC

## 2021-09-24 ENCOUNTER — Emergency Department (HOSPITAL_COMMUNITY): Payer: BC Managed Care – PPO

## 2021-09-24 ENCOUNTER — Emergency Department (HOSPITAL_COMMUNITY)
Admission: EM | Admit: 2021-09-24 | Discharge: 2021-09-24 | Disposition: A | Discharge status: Home / Self Care | Payer: BC Managed Care – PPO | Attending: Emergency Medicine | Admitting: Emergency Medicine

## 2021-09-24 DIAGNOSIS — Z7982 Long term (current) use of aspirin: Secondary | ICD-10-CM | POA: Insufficient documentation

## 2021-09-24 DIAGNOSIS — R519 Headache, unspecified: Secondary | ICD-10-CM | POA: Diagnosis not present

## 2021-09-24 DIAGNOSIS — H7292 Unspecified perforation of tympanic membrane, left ear: Secondary | ICD-10-CM | POA: Insufficient documentation

## 2021-09-24 DIAGNOSIS — R42 Dizziness and giddiness: Secondary | ICD-10-CM

## 2021-09-24 DIAGNOSIS — R531 Weakness: Secondary | ICD-10-CM | POA: Insufficient documentation

## 2021-09-24 DIAGNOSIS — H6690 Otitis media, unspecified, unspecified ear: Secondary | ICD-10-CM

## 2021-09-24 DIAGNOSIS — H6692 Otitis media, unspecified, left ear: Secondary | ICD-10-CM | POA: Diagnosis not present

## 2021-09-24 LAB — URINALYSIS, ROUTINE W REFLEX MICROSCOPIC
Bacteria, UA: NONE SEEN
Bilirubin Urine: NEGATIVE
Glucose, UA: NEGATIVE mg/dL
Ketones, ur: NEGATIVE mg/dL
Leukocytes,Ua: NEGATIVE
Nitrite: NEGATIVE
Protein, ur: NEGATIVE mg/dL
Specific Gravity, Urine: 1.012 (ref 1.005–1.030)
pH: 5 (ref 5.0–8.0)

## 2021-09-24 LAB — COMPREHENSIVE METABOLIC PANEL
ALT: 15 U/L (ref 0–44)
AST: 12 U/L — ABNORMAL LOW (ref 15–41)
Albumin: 4.3 g/dL (ref 3.5–5.0)
Alkaline Phosphatase: 66 U/L (ref 38–126)
Anion gap: 8 (ref 5–15)
BUN: 15 mg/dL (ref 6–20)
CO2: 27 mmol/L (ref 22–32)
Calcium: 9.9 mg/dL (ref 8.9–10.3)
Chloride: 104 mmol/L (ref 98–111)
Creatinine, Ser: 0.76 mg/dL (ref 0.44–1.00)
GFR, Estimated: >60 mL/min (ref >60)
Glucose, Bld: 102 mg/dL — ABNORMAL HIGH (ref 70–99)
Potassium: 4 mmol/L (ref 3.5–5.1)
Sodium: 139 mmol/L (ref 135–145)
Total Bilirubin: 0.9 mg/dL (ref 0.3–1.2)
Total Protein: 7.7 g/dL (ref 6.5–8.1)

## 2021-09-24 LAB — CBC
HCT: 46.7 % — ABNORMAL HIGH (ref 36.0–46.0)
Hemoglobin: 15 g/dL (ref 12.0–15.0)
MCH: 29.4 pg (ref 26.0–34.0)
MCHC: 32.1 g/dL (ref 30.0–36.0)
MCV: 91.4 fL (ref 80.0–100.0)
Platelets: 188 10*3/uL (ref 150–400)
RBC: 5.11 MIL/uL (ref 3.87–5.11)
RDW: 12.5 % (ref 11.5–15.5)
WBC: 10 10*3/uL (ref 4.0–10.5)
nRBC: 0 % (ref 0.0–0.2)

## 2021-09-24 LAB — TSH: TSH: 2.907 u[IU]/mL (ref 0.350–4.500)

## 2021-09-24 LAB — TROPONIN I (HIGH SENSITIVITY)
Troponin I (High Sensitivity): 3 ng/L (ref <18)
Troponin I (High Sensitivity): 3 ng/L (ref <18)

## 2021-09-24 MED ORDER — AMOXICILLIN-POT CLAVULANATE 875-125 MG PO TABS
1.0000 | ORAL_TABLET | Freq: Once | ORAL | Status: AC
  Administered 2021-09-24: 1 via ORAL
  Filled 2021-09-24: qty 1

## 2021-09-24 MED ORDER — IOHEXOL 350 MG/ML SOLN
80.0000 mL | Freq: Once | INTRAVENOUS | Status: AC | PRN
  Administered 2021-09-24: 80 mL via INTRAVENOUS

## 2021-09-24 MED ORDER — PROCHLORPERAZINE EDISYLATE 10 MG/2ML IJ SOLN
10.0000 mg | Freq: Once | INTRAMUSCULAR | Status: AC
  Administered 2021-09-24: 10 mg via INTRAVENOUS
  Filled 2021-09-24: qty 2

## 2021-09-24 MED ORDER — AMOXICILLIN-POT CLAVULANATE 875-125 MG PO TABS: 1.0000 | ORAL_TABLET | Freq: Two times a day (BID) | ORAL | 0 refills | Status: AC

## 2021-09-24 MED ORDER — DIPHENHYDRAMINE HCL 50 MG/ML IJ SOLN
50.0000 mg | Freq: Once | INTRAMUSCULAR | Status: AC
  Administered 2021-09-24: 50 mg via INTRAVENOUS
  Filled 2021-09-24: qty 1

## 2021-09-24 MED ORDER — SODIUM CHLORIDE 0.9 % IV BOLUS
1000.0000 mL | Freq: Once | INTRAVENOUS | Status: AC
  Administered 2021-09-24: 1000 mL via INTRAVENOUS

## 2021-09-24 NOTE — ED Triage Notes (Signed)
Pt states she started feeling dizzy about 3 hours ago. At that same time, pt states she started having pain all over and both arms felt heavy. Pt complains of fatigue.  ?

## 2021-09-24 NOTE — ED Provider Triage Note (Signed)
Emergency Medicine Provider Triage Evaluation Note ? ?Moses Manners , a 56 y.o. female  was evaluated in triage.  Pt complains of heaviness and weakness. Had sudden onset of a sensation of global heaviness when getting out of the car. Has associated dizziness (states that it feels like spinning and near syncope) 2 weeks ago had 2 episodes of severe chest pain and near syncope x 2 ( lasting a few seconds). Has known hx of vertigo. Feels SOB. +  smoking. Has associated left neck and shoulder pain. ? ?Review of Systems  ?Positive: weakness ?Negative: cough ? ?Physical Exam  ?BP (!) 155/87 (BP Location: Left Arm)   Pulse 64   Temp 98.3 ?F (36.8 ?C) (Oral)   Resp 18   Ht 5\' 2"  (1.575 m)   Wt 68.7 kg   LMP 02/06/2012   SpO2 97%   BMI 27.70 kg/m?  ?Gen:   Awake, no distress   ?Resp:  Normal effort  ?MSK:   Moves extremities without difficulty  ?Other:  RRR ? ?Medical Decision Making  ?Medically screening exam initiated at 2:29 PM.  Appropriate orders placed.  ZYERIA HAYRE was informed that the remainder of the evaluation will be completed by another provider, this initial triage assessment does not replace that evaluation, and the importance of remaining in the ED until their evaluation is complete. ? ?Cp- work up inititated ?  ?Margarita Mail, PA-C ?09/24/21 1435 ? ?

## 2021-09-24 NOTE — Discharge Instructions (Signed)
Your history, exam, work-up today did not show evidence of acute stroke or any acute vascular abnormality in her head or neck but your exam did not show evidence of acute otitis media or ear infection on the ear with perforated TM.  Your mastoids were nontender on exam thus we did not think he had mastoiditis however I did speak to pharmacy who recommended Augmentin as the best antibiotic to treat the ear infection.  Please follow-up with your ear nose and throat team as well as rest and stay hydrated.  If any symptoms change or worsen acutely, please return to the nearest emergency department. ?

## 2021-09-24 NOTE — ED Provider Notes (Signed)
?[facility]-EMERGENCY DEPT ?Provider Note ? ? ?CSN: [id number] ?Arrival date & time: 09/24/21  1248 ? ?  ? ?History ? ?Chief Complaint  ?Patient presents with  ? Dizziness  ? ? ?Charlotte Green is a 56 y.o. female. ? ?The history is provided by the patient and medical records. No language interpreter was used.  ?Dizziness ?Quality:  Head spinning ?Severity:  Moderate ?Onset quality:  Sudden ?Duration:  1 day ?Timing:  Intermittent ?Progression:  Waxing and waning ?Chronicity:  New ?Relieved by:  Nothing ?Worsened by:  Nothing ?Ineffective treatments:  None tried ?Associated symptoms: headaches and weakness (subjectve diffuse overall weakness)   ?Associated symptoms: no blood in stool, no chest pain (last week had a few episodes), no diarrhea, no nausea, no palpitations, no shortness of breath, no syncope, no vision changes (resolved now) and no vomiting   ?Risk factors: hx of vertigo   ? ?  ? ?Home Medications ?Prior to Admission medications   ?Medication Sig Start Date End Date Taking? Authorizing Provider  ?albuterol (VENTOLIN HFA) 108 (90 Base) MCG/ACT inhaler Inhale 1-2 puffs into the lungs every 6 (six) hours as needed for wheezing or shortness of breath. 07/10/21   Rhys Martini, PA-C  ?amoxicillin-clavulanate (AUGMENTIN) 875-125 MG tablet Take 1 tablet by mouth every 12 (twelve) hours. 07/10/21   Rhys Martini, PA-C  ?aspirin (ASPIRIN CHILDRENS) 81 MG chewable tablet Chew 1 tablet (81 mg total) by mouth 2 (two) times daily. For DVT prophylaxis after surgery 09/26/20   Vernetta Honey, PA-C  ?fluticasone (FLONASE) 50 MCG/ACT nasal spray Place 1 spray into both nostrils daily. 08/16/18   Dahlia Byes A, NP  ?methocarbamol (ROBAXIN) 500 MG tablet Take 1 tablet (500 mg total) by mouth every 8 (eight) hours as needed for muscle spasms. 09/26/20   Vernetta Honey, PA-C  ?   ? ?Allergies    ?Tylenol [acetaminophen]   ? ?Review of Systems   ?Review of Systems  ?Constitutional:  Positive for  fatigue. Negative for chills, diaphoresis and fever.  ?HENT:  Positive for ear pain. Negative for congestion.   ?Eyes:  Negative for photophobia and visual disturbance.  ?Respiratory:  Negative for apnea, chest tightness and shortness of breath.   ?Cardiovascular:  Negative for chest pain (last week had a few episodes), palpitations and syncope.  ?Gastrointestinal:  Negative for abdominal pain, blood in stool, diarrhea, nausea and vomiting.  ?Genitourinary:  Negative for dysuria.  ?     Foul smelling urine  ?Musculoskeletal:  Positive for neck pain. Negative for back pain and neck stiffness.  ?Skin:  Negative for rash and wound.  ?Neurological:  Positive for dizziness, weakness (subjectve diffuse overall weakness), light-headedness and headaches. Negative for syncope, facial asymmetry, speech difficulty and numbness.  ?Psychiatric/Behavioral:  Negative for agitation.   ?All other systems reviewed and are negative. ? ?Physical Exam ?Updated Vital Signs ?BP 135/77   Pulse 61   Temp 98.3 ?F (36.8 ?C) (Oral)   Resp (!) 23   Ht 5\' 2"  (1.575 m)   Wt 68.7 kg   LMP 02/06/2012   SpO2 97%   BMI 27.70 kg/m?  ?Physical Exam ?Vitals and nursing note reviewed.  ?Constitutional:   ?   General: She is not in acute distress. ?   Appearance: She is well-developed. She is not ill-appearing, toxic-appearing or diaphoretic.  ?HENT:  ?   Head: Atraumatic.  ?   Left Ear: No laceration. No mastoid tenderness. Tympanic membrane is erythematous.  ?  Ears:  ?   Comments: Left ear does have perforation appearance of TM with some purulence and erythema.  No mastoid tenderness ?   Nose: No congestion or rhinorrhea.  ?   Mouth/Throat:  ?   Mouth: Mucous membranes are dry.  ?   Pharynx: No oropharyngeal exudate or posterior oropharyngeal erythema.  ?Eyes:  ?   Extraocular Movements: Extraocular movements intact.  ?   Conjunctiva/sclera: Conjunctivae normal.  ?   Pupils: Pupils are equal, round, and reactive to light.  ?Cardiovascular:  ?    Rate and Rhythm: Normal rate and regular rhythm.  ?   Pulses: Normal pulses.  ?   Heart sounds: No murmur heard. ?Pulmonary:  ?   Effort: Pulmonary effort is normal. No respiratory distress.  ?   Breath sounds: Normal breath sounds. No wheezing, rhonchi or rales.  ?Chest:  ?   Chest wall: No tenderness.  ?Abdominal:  ?   General: Abdomen is flat. There is no distension.  ?   Palpations: Abdomen is soft.  ?   Tenderness: There is no abdominal tenderness. There is no right CVA tenderness, left CVA tenderness, guarding or rebound.  ?Musculoskeletal:     ?   General: No swelling or tenderness.  ?   Cervical back: Neck supple. No tenderness.  ?   Right lower leg: No edema.  ?   Left lower leg: No edema.  ?Skin: ?   General: Skin is warm and dry.  ?   Capillary Refill: Capillary refill takes less than 2 seconds.  ?   Findings: No erythema or rash.  ?Neurological:  ?   General: No focal deficit present.  ?   Mental Status: She is alert.  ?   Cranial Nerves: No cranial nerve deficit.  ?   Sensory: No sensory deficit.  ?   Motor: No weakness.  ?   Coordination: Coordination normal.  ?Psychiatric:     ?   Mood and Affect: Mood normal.  ? ? ?ED Results / Procedures / Treatments   ?Labs ?(all labs ordered are listed, but only abnormal results are displayed) ?Labs Reviewed  ?CBC - Abnormal; Notable for the following components:  ?    Result Value  ? HCT 46.7 (*)   ? All other components within normal limits  ?COMPREHENSIVE METABOLIC PANEL - Abnormal; Notable for the following components:  ? Glucose, Bld 102 (*)   ? AST 12 (*)   ? All other components within normal limits  ?URINALYSIS, ROUTINE W REFLEX MICROSCOPIC - Abnormal; Notable for the following components:  ? Hgb urine dipstick MODERATE (*)   ? All other components within normal limits  ?URINE CULTURE  ?TSH  ?TROPONIN I (HIGH SENSITIVITY)  ?TROPONIN I (HIGH SENSITIVITY)  ? ? ?EKG ?EKG Interpretation ? ?Date/Time:  Wednesday Sep 24 2021 18:25:07 EDT ?Ventricular Rate:   67 ?PR Interval:  146 ?QRS Duration: 84 ?QT Interval:  419 ?QTC Calculation: 443 ?R Axis:   88 ?Text Interpretation: Sinus rhythm LAE, consider biatrial enlargement RSR' in V1 or V2, right VCD or RVH when compared to prior, similar appearance. No STEMI Confirmed by Theda Belfast ([id number]) on 09/24/2021 6:32:34 PM ? ?Radiology ?CT Angio Head W or Wo Contrast ? ?Result Date: 09/24/2021 ?CLINICAL DATA:  Headache and left neck pain EXAM: CT ANGIOGRAPHY HEAD AND NECK TECHNIQUE: Multidetector CT imaging of the head and neck was performed using the standard protocol during bolus administration of intravenous contrast. Multiplanar CT image reconstructions and MIPs were  obtained to evaluate the vascular anatomy. Carotid stenosis measurements (when applicable) are obtained utilizing NASCET criteria, using the distal internal carotid diameter as the denominator. RADIATION DOSE REDUCTION: This exam was performed according to the departmental dose-optimization program which includes automated exposure control, adjustment of the mA and/or kV according to patient size and/or use of iterative reconstruction technique. CONTRAST:  80mL OMNIPAQUE IOHEXOL 350 MG/ML SOLN COMPARISON:  None Available. FINDINGS: CT HEAD FINDINGS Brain: There is no mass, hemorrhage or extra-axial collection. The size and configuration of the ventricles and extra-axial CSF spaces are normal. There is no acute or chronic infarction. The brain parenchyma is normal. Skull: The visualized skull base, calvarium and extracranial soft tissues are normal. Sinuses/Orbits: No fluid levels or advanced mucosal thickening of the visualized paranasal sinuses. No mastoid or middle ear effusion. The orbits are normal. CTA NECK FINDINGS SKELETON: There is no bony spinal canal stenosis. No lytic or blastic lesion. OTHER NECK: Normal pharynx, larynx and major salivary glands. No cervical lymphadenopathy. Unremarkable thyroid gland. UPPER CHEST: Mild biapical emphysema. AORTIC  ARCH: There is no calcific atherosclerosis of the aortic arch. There is no aneurysm, dissection or hemodynamically significant stenosis of the visualized portion of the aorta. Conventional 3 vessel aortic branching patt

## 2021-09-26 LAB — URINE CULTURE: Culture: <10000 — AB

## 2021-10-10 ENCOUNTER — Ambulatory Visit (INDEPENDENT_AMBULATORY_CARE_PROVIDER_SITE_OTHER): Payer: BC Managed Care – PPO | Admitting: Otolaryngology

## 2022-04-23 ENCOUNTER — Ambulatory Visit
Admission: RE | Admit: 2022-04-23 | Discharge: 2022-04-23 | Disposition: A | Discharge status: Home / Self Care | Payer: BC Managed Care – PPO | Source: Ambulatory Visit | Attending: Urgent Care | Admitting: Urgent Care

## 2022-04-23 VITALS — BP 114/82 | HR 87 | Temp 98.3°F | Resp 20

## 2022-04-23 DIAGNOSIS — Z792 Long term (current) use of antibiotics: Secondary | ICD-10-CM | POA: Insufficient documentation

## 2022-04-23 DIAGNOSIS — H65196 Other acute nonsuppurative otitis media, recurrent, bilateral: Secondary | ICD-10-CM | POA: Diagnosis not present

## 2022-04-23 DIAGNOSIS — F17200 Nicotine dependence, unspecified, uncomplicated: Secondary | ICD-10-CM | POA: Diagnosis not present

## 2022-04-23 DIAGNOSIS — Z79899 Other long term (current) drug therapy: Secondary | ICD-10-CM | POA: Diagnosis not present

## 2022-04-23 DIAGNOSIS — B349 Viral infection, unspecified: Secondary | ICD-10-CM | POA: Insufficient documentation

## 2022-04-23 DIAGNOSIS — R062 Wheezing: Secondary | ICD-10-CM | POA: Insufficient documentation

## 2022-04-23 DIAGNOSIS — Z1152 Encounter for screening for COVID-19: Secondary | ICD-10-CM | POA: Insufficient documentation

## 2022-04-23 MED ORDER — PROMETHAZINE-DM 6.25-15 MG/5ML PO SYRP: 2.5000 mL | ORAL_SOLUTION | Freq: Three times a day (TID) | ORAL | 0 refills | Status: DC | PRN

## 2022-04-23 MED ORDER — AMOXICILLIN-POT CLAVULANATE 875-125 MG PO TABS: 1.0000 | ORAL_TABLET | Freq: Two times a day (BID) | ORAL | 0 refills | Status: DC

## 2022-04-23 MED ORDER — PSEUDOEPHEDRINE HCL 60 MG PO TABS: 60.0000 mg | ORAL_TABLET | Freq: Three times a day (TID) | ORAL | 0 refills | Status: DC | PRN

## 2022-04-23 MED ORDER — CETIRIZINE HCL 10 MG PO TABS: 10.0000 mg | ORAL_TABLET | Freq: Every day | ORAL | 0 refills | Status: DC

## 2022-04-23 NOTE — ED Triage Notes (Signed)
Pt c/o prod cough, nasal congestion, dizziness, bilat earache x 3 days-NAD-steady gait

## 2022-04-23 NOTE — Discharge Instructions (Addendum)
We will notify you of your test results as they arrive and may take between about 24 hours.  I encourage you to sign up for MyChart if you have not already done so as this can be the easiest way for [place] to communicate results to you online or through a phone app.  Generally, we only contact you if it is a positive test result.  In the meantime, if you develop worsening symptoms including fever, chest pain, shortness of breath despite our current treatment plan then please report to the emergency room as this may be a sign of worsening status from possible viral infection.  Otherwise, we will manage your ear infection with amoxicillin-clavulanate. For sore throat or cough try using a honey-based tea. Use 3 teaspoons of honey with juice squeezed from half lemon. Place shaved pieces of ginger into 1/2-1 cup of water and warm over stove top. Then mix the ingredients and repeat every 4 hours as needed. Please take Tylenol 500mg -650mg  every 6 hours for aches and pains, fevers. Hydrate very well with at least 2 liters of water. Eat light meals such as soups to replenish electrolytes and soft fruits, veggies. Start an antihistamine like Zyrtec for postnasal drainage, sinus congestion.  You can take this together with pseudoephedrine (Sudafed) at a dose of 60 mg 2-3 times a day as needed for the same kind of congestion.  Use the cough medications as needed.

## 2022-04-23 NOTE — ED Provider Notes (Signed)
[facility] - [facility]  Note:  This document was prepared using Conservation officer, historic buildings and may include unintentional dictation errors.  MRN: [id number] DOB: Apr 02, 1966  Subjective:   Charlotte Green is a 56 y.o. female presenting for 3-day history of acute onset persistent and worsening severe bilateral ear pain.  Has a history of recurrent ear infections, has had a tympanoplasty as well, last completed 2013.  She has not followed up with ENT specialist in a while.  Would like some recommendations.  She is also had persistent coughing, mild wheezing at night.  Has an albuterol inhaler but is not using it.  Feels like her breathing is not that bad.  Smokes 1 pack/day.  No overt fever, chest pain, shortness of breath.  Patient would like to get a COVID test.  She is not opposed to COVID antivirals if she test positive.  No history of CKD, last GFR was greater than 42mL/min earlier this year.  No current facility-administered medications for this encounter.  Current Outpatient Medications:    albuterol (VENTOLIN HFA) 108 (90 Base) MCG/ACT inhaler, Inhale 1-2 puffs into the lungs every 6 (six) hours as needed for wheezing or shortness of breath., Disp: 1 each, Rfl: 0   amoxicillin-clavulanate (AUGMENTIN) 875-125 MG tablet, Take 1 tablet by mouth every 12 (twelve) hours., Disp: 14 tablet, Rfl: 0   aspirin (ASPIRIN CHILDRENS) 81 MG chewable tablet, Chew 1 tablet (81 mg total) by mouth 2 (two) times daily. For DVT prophylaxis after surgery, Disp: 84 tablet, Rfl: 0   fluticasone (FLONASE) 50 MCG/ACT nasal spray, Place 1 spray into both nostrils daily., Disp: 16 g, Rfl: 2   methocarbamol (ROBAXIN) 500 MG tablet, Take 1 tablet (500 mg total) by mouth every 8 (eight) hours as needed for muscle spasms., Disp: 20 tablet, Rfl: 0   Allergies  Allergen Reactions   Tylenol [Acetaminophen] Shortness Of Breath and Swelling    Past Medical History:  Diagnosis Date   Hearing loss     lt p hit in head 10/11   No pertinent past medical history      Past Surgical History:  Procedure Laterality Date   CARPAL TUNNEL RELEASE  2008   lt   CERVICAL DISC SURGERY  2008   CERVICAL FUSION  2008   CHONDROPLASTY Right 09/26/2020   Procedure: CHONDROPLASTY;  Surgeon: Bjorn Pippin, MD;  Location: [facility];  Service: Orthopedics;  Laterality: Right;   KNEE ARTHROSCOPY WITH LATERAL MENISECTOMY Right 09/26/2020   Procedure: RIGHT KNEE ARTHROSCOPY WITH LATERAL MENISECTOMY; DEBRIDEMENT;  Surgeon: Bjorn Pippin, MD;  Location: [facility];  Service: Orthopedics;  Laterality: Right;   SYNOVECTOMY Right 09/26/2020   Procedure: SYNOVECTOMY; SYNOVIAL BIOPSY;  Surgeon: Bjorn Pippin, MD;  Location: [facility];  Service: Orthopedics;  Laterality: Right;   TYMPANOPLASTY  06/05/2011   Procedure: TYMPANOPLASTY;  Surgeon: Drema Halon, MD;  Location: [facility];  Service: ENT;  Laterality: Left;  with ossicular reconstruction w/ Cartilage Graft    No family history on file.  Social History   Tobacco Use   Smoking status: Every Day    Packs/day: 1.00    Types: Cigarettes   Smokeless tobacco: Never  Vaping Use   Vaping Use: Never used  Substance Use Topics   Alcohol use: Yes    Comment: occ   Drug use: No    ROS   Objective:   Vitals: BP 114/82 (BP Location: Right  Arm)   Pulse 87   Temp 98.3 F (36.8 C) (Oral)   Resp 20   LMP 02/06/2012   SpO2 96%   Physical Exam Constitutional:      General: She is not in acute distress.    Appearance: Normal appearance. She is well-developed and normal weight. She is not ill-appearing, toxic-appearing or diaphoretic.  HENT:     Head: Normocephalic and atraumatic.     Right Ear: Ear canal and external ear normal. No drainage or tenderness. No middle ear effusion. There is no impacted cerumen. Tympanic membrane is erythematous. Tympanic membrane is not bulging.     Left Ear:  Ear canal and external ear normal. No drainage or tenderness.  No middle ear effusion. There is no impacted cerumen. Tympanic membrane is erythematous and bulging.     Nose: No congestion or rhinorrhea.     Mouth/Throat:     Mouth: Mucous membranes are moist. No oral lesions.     Pharynx: No pharyngeal swelling, oropharyngeal exudate, posterior oropharyngeal erythema or uvula swelling.     Tonsils: No tonsillar exudate or tonsillar abscesses.  Eyes:     General: No scleral icterus.       Right eye: No discharge.        Left eye: No discharge.     Extraocular Movements: Extraocular movements intact.     Right eye: Normal extraocular motion.     Left eye: Normal extraocular motion.     Conjunctiva/sclera: Conjunctivae normal.  Cardiovascular:     Rate and Rhythm: Normal rate.  Pulmonary:     Effort: Pulmonary effort is normal.  Musculoskeletal:     Cervical back: Normal range of motion and neck supple.  Lymphadenopathy:     Cervical: No cervical adenopathy.  Skin:    General: Skin is warm and dry.  Neurological:     General: No focal deficit present.     Mental Status: She is alert and oriented to person, place, and time.  Psychiatric:        Mood and Affect: Mood normal.        Behavior: Behavior normal.     Assessment and Plan :   PDMP not reviewed this encounter.  1. Other recurrent acute nonsuppurative otitis media of both ears   2. Acute viral syndrome   3. Smoker     COVID testing pending, patient will be an excellent candidate for Paxlovid.  She can take the full dose.  Otherwise, we will manage for bilateral otitis media with Augmentin. Use supportive care otherwise for secondary viral syndrome.    Discussed possibility of using prednisone but patient prefers to avoid this now.  I advised that she needs to start using her albuterol inhaler especially in the context of her smoking and wheezing at night.   Deferred imaging given clear cardiopulmonary exam,  hemodynamically stable vital signs. Counseled patient on potential for adverse effects with medications prescribed/recommended today, ER and return-to-clinic precautions discussed, patient verbalized understanding.    Wallis Bamberg, PA-C 04/23/22 1431

## 2022-04-24 LAB — SARS CORONAVIRUS 2 (TAT 6-24 HRS): SARS Coronavirus 2: NEGATIVE

## 2023-02-08 ENCOUNTER — Encounter (HOSPITAL_COMMUNITY): Payer: Self-pay | Admitting: Emergency Medicine

## 2023-02-08 ENCOUNTER — Other Ambulatory Visit: Payer: Self-pay

## 2023-02-08 ENCOUNTER — Emergency Department (HOSPITAL_COMMUNITY)
Admission: EM | Admit: 2023-02-08 | Discharge: 2023-02-09 | Discharge status: Against Medical Advice | Payer: BC Managed Care – PPO | Attending: Emergency Medicine | Admitting: Emergency Medicine

## 2023-02-08 DIAGNOSIS — R6883 Chills (without fever): Secondary | ICD-10-CM | POA: Diagnosis not present

## 2023-02-08 DIAGNOSIS — R110 Nausea: Secondary | ICD-10-CM | POA: Insufficient documentation

## 2023-02-08 DIAGNOSIS — R42 Dizziness and giddiness: Secondary | ICD-10-CM | POA: Insufficient documentation

## 2023-02-08 DIAGNOSIS — H538 Other visual disturbances: Secondary | ICD-10-CM | POA: Diagnosis not present

## 2023-02-08 DIAGNOSIS — R140 Abdominal distension (gaseous): Secondary | ICD-10-CM | POA: Diagnosis present

## 2023-02-08 LAB — URINALYSIS, ROUTINE W REFLEX MICROSCOPIC
Bilirubin Urine: NEGATIVE
Glucose, UA: NEGATIVE mg/dL
Ketones, ur: NEGATIVE mg/dL
Nitrite: NEGATIVE
Protein, ur: NEGATIVE mg/dL
Specific Gravity, Urine: 1.015 (ref 1.005–1.030)
pH: 5 (ref 5.0–8.0)

## 2023-02-08 LAB — COMPREHENSIVE METABOLIC PANEL
ALT: 16 U/L (ref 0–44)
AST: 15 U/L (ref 15–41)
Albumin: 4.1 g/dL (ref 3.5–5.0)
Alkaline Phosphatase: 68 U/L (ref 38–126)
Anion gap: 6 (ref 5–15)
BUN: 16 mg/dL (ref 6–20)
CO2: 26 mmol/L (ref 22–32)
Calcium: 9.1 mg/dL (ref 8.9–10.3)
Chloride: 102 mmol/L (ref 98–111)
Creatinine, Ser: 0.85 mg/dL (ref 0.44–1.00)
GFR, Estimated: >60 mL/min (ref >60)
Glucose, Bld: 102 mg/dL — ABNORMAL HIGH (ref 70–99)
Potassium: 4.4 mmol/L (ref 3.5–5.1)
Sodium: 134 mmol/L — ABNORMAL LOW (ref 135–145)
Total Bilirubin: 0.7 mg/dL (ref 0.3–1.2)
Total Protein: 7.3 g/dL (ref 6.5–8.1)

## 2023-02-08 LAB — CBC
HCT: 43.1 % (ref 36.0–46.0)
Hemoglobin: 14 g/dL (ref 12.0–15.0)
MCH: 29.9 pg (ref 26.0–34.0)
MCHC: 32.5 g/dL (ref 30.0–36.0)
MCV: 92.1 fL (ref 80.0–100.0)
Platelets: 198 10*3/uL (ref 150–400)
RBC: 4.68 MIL/uL (ref 3.87–5.11)
RDW: 12.7 % (ref 11.5–15.5)
WBC: 11.4 10*3/uL — ABNORMAL HIGH (ref 4.0–10.5)
nRBC: 0 % (ref 0.0–0.2)

## 2023-02-08 LAB — LIPASE, BLOOD: Lipase: 23 U/L (ref 11–51)

## 2023-02-08 NOTE — ED Provider Triage Note (Signed)
Emergency Medicine Provider Triage Evaluation Note  Charlotte Green , a 57 y.o. female  was evaluated in triage.    Diarrhea 1 week ago, looked bright red blood when she wiped Had a little bit of blood and diarrhea the following day, blood has resolved but stools remain loose Ate at a restaurant and the chicken she was eating didn't look completely cooked Having tunnel vision, making it difficult to work or drive Has also had some urinary urgency, but not having much urine come out  No recent travel or antibiotic use  Review of Systems  Positive: Diarrhea, blood in stool, fatigue, dizziness, low back pain, intermittent abd pain, urinary urgency Negative: Syncope, fever, chills, dysuria, hematuria, vaginal bleeding or discharge  Physical Exam  BP 123/68 (BP Location: Left Arm)   Pulse 78   Temp 98.5 F (36.9 C) (Oral)   Resp 18   LMP 02/06/2012   SpO2 99%  Gen:   Awake, no distress   Resp:  Normal effort  MSK:   Moves extremities without difficulty  Other:    Medical Decision Making  Medically screening exam initiated at 6:41 PM.  Appropriate orders placed.  [name] was informed that the remainder of the evaluation will be completed by another provider, this initial triage assessment does not replace that evaluation, and the importance of remaining in the ED until their evaluation is complete.  Workup initiated   Su Monks, PA-C 02/08/23 1846

## 2023-02-08 NOTE — ED Triage Notes (Signed)
Pt c/o abdominal pain with bloating and intermittent bloody diarrhea x a couple of weeks.

## 2023-02-09 NOTE — ED Notes (Signed)
Pt refused ortho stats and EKG

## 2023-02-09 NOTE — ED Provider Notes (Signed)
[place] EMERGENCY DEPARTMENT AT [facility] Provider Note  CSN: [id number] Arrival date & time: 02/08/23 1756  Chief Complaint(s) Abdominal Pain  HPI Charlotte Green is a 57 y.o. female {Add pertinent medical, surgical, social history, OB history to HPI:1}    Abdominal Pain   Past Medical History Past Medical History:  Diagnosis Date   Hearing loss    lt p hit in head 10/11   No pertinent past medical history    There are no problems to display for this patient.  Home Medication(s) Prior to Admission medications   Medication Sig Start Date End Date Taking? Authorizing Provider  albuterol (VENTOLIN HFA) 108 (90 Base) MCG/ACT inhaler Inhale 1-2 puffs into the lungs every 6 (six) hours as needed for wheezing or shortness of breath. 07/10/21   Rhys Martini, PA-C  amoxicillin-clavulanate (AUGMENTIN) 875-125 MG tablet Take 1 tablet by mouth every 12 (twelve) hours. 04/23/22   Wallis Bamberg, PA-C  cetirizine (ZYRTEC ALLERGY) 10 MG tablet Take 1 tablet (10 mg total) by mouth daily. 04/23/22   Wallis Bamberg, PA-C  fluticasone (FLONASE) 50 MCG/ACT nasal spray Place 1 spray into both nostrils daily. 08/16/18   Dahlia Byes A, NP  promethazine-dextromethorphan (PROMETHAZINE-DM) 6.25-15 MG/5ML syrup Take 2.5 mLs by mouth 3 (three) times daily as needed for cough. 04/23/22   Wallis Bamberg, PA-C  pseudoephedrine (SUDAFED) 60 MG tablet Take 1 tablet (60 mg total) by mouth every 8 (eight) hours as needed for congestion. 04/23/22   Wallis Bamberg, PA-C                                                                                                                                    Allergies Tylenol [acetaminophen]  Review of Systems Review of Systems  Gastrointestinal:  Positive for abdominal pain.   As noted in HPI  Physical Exam Vital Signs  I have reviewed the triage vital signs BP 117/60 (BP Location: Left Arm)   Pulse 71   Temp 97.7 F (36.5 C) (Oral)   Resp 17   Ht 5\' 2"   (1.575 m)   Wt 69 kg   LMP 02/06/2012   SpO2 99%   BMI 27.82 kg/m  *** Physical Exam  ED Results and Treatments Labs (all labs ordered are listed, but only abnormal results are displayed) Labs Reviewed  COMPREHENSIVE METABOLIC PANEL - Abnormal; Notable for the following components:      Result Value   Sodium 134 (*)    Glucose, Bld 102 (*)    All other components within normal limits  CBC - Abnormal; Notable for the following components:   WBC 11.4 (*)    All other components within normal limits  URINALYSIS, ROUTINE W REFLEX MICROSCOPIC - Abnormal; Notable for the following components:   Hgb urine dipstick MODERATE (*)    Leukocytes,Ua SMALL (*)    Bacteria, UA RARE (*)    All other components  within normal limits  LIPASE, BLOOD                                                                                                                         EKG  EKG Interpretation Date/Time:    Ventricular Rate:    PR Interval:    QRS Duration:    QT Interval:    QTC Calculation:   R Axis:      Text Interpretation:         Radiology No results found.  Medications Ordered in ED Medications - No data to display Procedures Procedures  (including critical care time) Medical Decision Making / ED Course   Medical Decision Making   ***    Final Clinical Impression(s) / ED Diagnoses Final diagnoses:  None    This chart was dictated using voice recognition software.  Despite best efforts to proofread,  errors can occur which can change the documentation meaning.

## 2024-06-15 ENCOUNTER — Other Ambulatory Visit (HOSPITAL_BASED_OUTPATIENT_CLINIC_OR_DEPARTMENT_OTHER): Payer: Self-pay

## 2024-06-15 ENCOUNTER — Encounter (HOSPITAL_BASED_OUTPATIENT_CLINIC_OR_DEPARTMENT_OTHER): Payer: Self-pay

## 2024-06-15 ENCOUNTER — Other Ambulatory Visit: Payer: Self-pay

## 2024-06-15 ENCOUNTER — Emergency Department (HOSPITAL_BASED_OUTPATIENT_CLINIC_OR_DEPARTMENT_OTHER)
Admission: EM | Admit: 2024-06-15 | Discharge: 2024-06-15 | Disposition: A | Discharge status: Home / Self Care | Attending: Emergency Medicine | Admitting: Emergency Medicine

## 2024-06-15 DIAGNOSIS — N12 Tubulo-interstitial nephritis, not specified as acute or chronic: Secondary | ICD-10-CM | POA: Diagnosis not present

## 2024-06-15 DIAGNOSIS — R1032 Left lower quadrant pain: Secondary | ICD-10-CM | POA: Diagnosis present

## 2024-06-15 LAB — CBC WITH DIFFERENTIAL/PLATELET
Abs Immature Granulocytes: 0.02 K/uL (ref 0.00–0.07)
Basophils Absolute: 0 K/uL (ref 0.0–0.1)
Basophils Relative: 1 %
Eosinophils Absolute: 0.2 K/uL (ref 0.0–0.5)
Eosinophils Relative: 2 %
HCT: 42.1 % (ref 36.0–46.0)
Hemoglobin: 13.8 g/dL (ref 12.0–15.0)
Immature Granulocytes: 0 %
Lymphocytes Relative: 26 %
Lymphs Abs: 2.3 K/uL (ref 0.7–4.0)
MCH: 29.7 pg (ref 26.0–34.0)
MCHC: 32.8 g/dL (ref 30.0–36.0)
MCV: 90.5 fL (ref 80.0–100.0)
Monocytes Absolute: 0.4 K/uL (ref 0.1–1.0)
Monocytes Relative: 5 %
Neutro Abs: 5.9 K/uL (ref 1.7–7.7)
Neutrophils Relative %: 66 %
Platelets: 196 K/uL (ref 150–400)
RBC: 4.65 MIL/uL (ref 3.87–5.11)
RDW: 12.7 % (ref 11.5–15.5)
WBC: 8.8 K/uL (ref 4.0–10.5)
nRBC: 0 % (ref 0.0–0.2)

## 2024-06-15 LAB — URINALYSIS, ROUTINE W REFLEX MICROSCOPIC
Bilirubin Urine: NEGATIVE
Glucose, UA: NEGATIVE mg/dL
Ketones, ur: NEGATIVE mg/dL
Nitrite: NEGATIVE
Protein, ur: NEGATIVE mg/dL
Specific Gravity, Urine: 1.01 (ref 1.005–1.030)
pH: 5.5 (ref 5.0–8.0)

## 2024-06-15 LAB — COMPREHENSIVE METABOLIC PANEL WITH GFR
ALT: 11 U/L (ref 0–44)
AST: 17 U/L (ref 15–41)
Albumin: 4.2 g/dL (ref 3.5–5.0)
Alkaline Phosphatase: 79 U/L (ref 38–126)
Anion gap: 10 (ref 5–15)
BUN: 11 mg/dL (ref 6–20)
CO2: 26 mmol/L (ref 22–32)
Calcium: 9.4 mg/dL (ref 8.9–10.3)
Chloride: 105 mmol/L (ref 98–111)
Creatinine, Ser: 0.73 mg/dL (ref 0.44–1.00)
GFR, Estimated: >60 mL/min
Glucose, Bld: 117 mg/dL — ABNORMAL HIGH (ref 70–99)
Potassium: 3.5 mmol/L (ref 3.5–5.1)
Sodium: 141 mmol/L (ref 135–145)
Total Bilirubin: 0.5 mg/dL (ref 0.0–1.2)
Total Protein: 6.7 g/dL (ref 6.5–8.1)

## 2024-06-15 LAB — URINALYSIS, MICROSCOPIC (REFLEX)

## 2024-06-15 LAB — LIPASE, BLOOD: Lipase: 17 U/L (ref 11–51)

## 2024-06-15 MED ORDER — CEPHALEXIN 500 MG PO CAPS
500.0000 mg | ORAL_CAPSULE | Freq: Two times a day (BID) | ORAL | 0 refills | Status: AC
  Filled 2024-06-15: qty 20, 10d supply, fill #0

## 2024-06-15 MED ORDER — EPINEPHRINE 0.3 MG/0.3ML IJ SOAJ
INTRAMUSCULAR | Status: AC
  Filled 2024-06-15: qty 0.3

## 2024-06-15 MED ORDER — SODIUM CHLORIDE 0.9 % IV SOLN
1.0000 g | Freq: Once | INTRAVENOUS | Status: AC
  Administered 2024-06-15: 1 g via INTRAVENOUS
  Filled 2024-06-15: qty 10

## 2024-06-15 NOTE — ED Notes (Signed)
 Discharge instructions reviewed with patient. Patient questions answered and opportunity for education reviewed. Patient voices understanding of discharge instructions with no further questions. Patient ambulatory with steady gait to lobby.

## 2024-06-15 NOTE — ED Triage Notes (Signed)
 L sided flank pain, dysuria for 3 days.  Denies NV, fevers

## 2024-06-15 NOTE — ED Notes (Signed)
 ED Provider at bedside.

## 2024-06-15 NOTE — ED Provider Notes (Signed)
 " [place] EMERGENCY DEPARTMENT AT [facility] Provider Note   CSN: [id number] Arrival date & time: 06/15/24  1409     Patient presents with: Flank Pain   Charlotte Green is a 59 y.o. female with no significant past medical history presents with concern for dysuria and increased urinary frequency for the past 3 days.  She also reports some left lower quadrant abdominal pain that radiates into her left flank.  She denies any nausea, vomiting, fever, or chills.  Reports 1 episode of nonbloody diarrhea this morning, otherwise normal bowel movements.  She denies any injuries to her back such as falls or MVC's.  Denies any history of malignancy or IV drug use.  No bowel or bladder incontinence, urinary tension, or saddle anesthesia.    Flank Pain       Prior to Admission medications  Medication Sig Start Date End Date Taking? Authorizing Provider  cephALEXin  (KEFLEX ) 500 MG capsule Take 1 capsule (500 mg total) by mouth 2 (two) times daily for 10 days. 06/16/24 06/26/24 Yes Veta Palma, PA-C  albuterol  (VENTOLIN  HFA) 108 (90 Base) MCG/ACT inhaler Inhale 1-2 puffs into the lungs every 6 (six) hours as needed for wheezing or shortness of breath. 07/10/21   Arlyss Leita BRAVO, PA-C    Allergies: Tylenol  [acetaminophen ]    Review of Systems  Genitourinary:  Positive for flank pain.    Updated Vital Signs BP (!) 141/77   Pulse 79   Temp 98.1 F (36.7 C) (Oral)   Resp 18   LMP 02/06/2012   SpO2 99%   Physical Exam Vitals and nursing note reviewed.  Constitutional:      General: She is not in acute distress.    Appearance: She is well-developed.  HENT:     Head: Normocephalic and atraumatic.  Eyes:     Conjunctiva/sclera: Conjunctivae normal.  Cardiovascular:     Rate and Rhythm: Normal rate and regular rhythm.     Heart sounds: No murmur heard. Pulmonary:     Effort: Pulmonary effort is normal. No respiratory distress.     Breath sounds: Normal breath sounds.   Abdominal:     Palpations: Abdomen is soft.     Tenderness: There is abdominal tenderness. There is left CVA tenderness.     Comments: Mild tenderness in the left lower quadrant without rebound or guarding.  Left-sided CVA tenderness.  Musculoskeletal:        General: No swelling.     Cervical back: Neck supple.     Comments: Tender over the left gluteal muscle No skin changes of the back  Skin:    General: Skin is warm and dry.     Capillary Refill: Capillary refill takes less than 2 seconds.  Neurological:     Mental Status: She is alert.     Comments: 5/5 strength with resisted hip flexion and extension bilaterally, knee flexion and extension bilaterally, ankle plantarflexion and dorsiflexion bilaterally  Intact sensation to the lower extremities diffusely    Psychiatric:        Mood and Affect: Mood normal.     (all labs ordered are listed, but only abnormal results are displayed) Labs Reviewed  URINALYSIS, ROUTINE W REFLEX MICROSCOPIC - Abnormal; Notable for the following components:      Result Value   Hgb urine dipstick SMALL (*)    Leukocytes,Ua TRACE (*)    All other components within normal limits  COMPREHENSIVE METABOLIC PANEL WITH GFR - Abnormal; Notable for the  following components:   Glucose, Bld 117 (*)    All other components within normal limits  URINALYSIS, MICROSCOPIC (REFLEX) - Abnormal; Notable for the following components:   Bacteria, UA RARE (*)    All other components within normal limits  URINE CULTURE  CBC WITH DIFFERENTIAL/PLATELET  LIPASE, BLOOD    EKG: None  Radiology: No results found.   Procedures   Medications Ordered in the ED  cefTRIAXone  (ROCEPHIN ) 1 g in sodium chloride  0.9 % 100 mL IVPB (0 g Intravenous Stopped 06/15/24 1635)                                    Medical Decision Making Amount and/or Complexity of Data Reviewed Labs: ordered.  Risk Prescription drug management.     Differential diagnosis includes  but is not limited to acute cholecystitis, cholelithiasis, cholangitis, choledocholithiasis, peptic ulcer, gastritis, gastroenteritis, appendicitis, IBS, IBD, DKA, nephrolithiasis, UTI, pyelonephritis, pancreatitis, diverticulitis, mesenteric ischemia, abdominal aortic aneurysm, small bowel obstruction, volvulus   ED Course:  Upon initial evaluation, patient is very well-appearing, no acute distress.  Normal vitals aside from elevated blood pressure 152/81 upon arrival.  I did review her urgent care visit from earlier today where the provider recommended she come to the ER for evaluation of possible cauda equina.  However, patient without clinical symptoms to suggest cauda equina.  She does not have any bowel or bladder incontinence or urinary retention.  She notes that last night she woke up and felt like she had to have a bowel movement, and then went to the bathroom to have her bowel movement. Patient denies any complete loss of bowel control.  I also specifically asked patient if she has any urinary incontinence, but she states she only dribbles a little bit of urine when she coughs (consistent with stress incontinence), but otherwise no urinary incontinence.  Patient denies any numbness/paresthesias in her groin.  On exam, she has 5/5 strength throughout the lower extremity, able to ambulate without difficulty, and has intact sensation of the lower extremities bilaterally.  She denies any personal history of malignancy, low concern for a compressive lesion/malignancy in the spine.  She also denies any history of IV drug use, no fevers, extremely low clinical concern for a epidural abscess.  Has clinical concern at this time for her symptoms of increased urinary frequency and dysuria that is UTI/pyelonephritis, and possible diverticulitis given the left lower quadrant abdominal pain and diarrhea last night.    Labs Ordered: I Ordered, and personally interpreted labs.  The pertinent results include:    Urinalysis with trace leukocytes, negative for nitrites.  Bacteria noted on microscopic reflex.  No squamous epithelial cells noted, appears to be a clean-catch CBC within normal limits, no leukocytosis CMP with mildly of a glucose of 117, otherwise within normal limits Lipase within normal limits  Medications Given: 1 g ceftriaxone   Upon re-evaluation, patient remains well-appearing with stable vitals.  Her workup is reassuring today.  No leukocytosis, no fever, no tachycardia, no concern for sepsis at this time.  Her CMP and lipase are within normal limits.  Abdomen overall soft and nontender, I have low clinical concern for acute intra-abdominal pathology such as diverticulitis, appendicitis, or other at this time. Patient's symptoms of dysuria and increased urinary frequency seem consistent with UTI especially with the leukocytes and bacteria noted on her urinalysis.  Patient is reporting some left flank pain.  Will go  ahead and treat for pyelonephritis with 10-day course of Keflex  at home.  Will give dose of ceftriaxone  here today.  She denies any signs of systemic infection, tolerating p.o. intake, stable and appropriate for discharge home at this time    Impression: Pyelonephritis  Disposition:  The patient was discharged home with instructions to take 10-day course of Keflex  as prescribed.  Follow-up with PCP within the next week for recheck of symptoms. Return precautions given and patient verbalized understanding.     Record Review: External records from outside source obtained and reviewed including urgent care note from earlier today     This chart was dictated using voice recognition software, Dragon. Despite the best efforts of this provider to proofread and correct errors, errors may still occur which can change documentation meaning.       Final diagnoses:  Pyelonephritis    ED Discharge Orders          Ordered    cephALEXin  (KEFLEX ) 500 MG capsule  2 times  daily        06/15/24 1609               Veta Palma, PA-C 06/15/24 1632    Doretha Folks, MD 06/15/24 1815  "

## 2024-06-15 NOTE — Discharge Instructions (Addendum)
 You were found to have a urinary tract infection that is also involving your kidneys. This is called pyelonephritis. Your urine has been sent off for culture to see what bacteria is in your urine. If your antibiotic needs to be changed, you will be contacted.  Medications: You have been prescribed an antibiotic called cephalexin  (Keflex ). Take this antibiotic 2 times a day for the next 10 days. Take the full course of your antibiotic even if you start feeling better. Antibiotics may cause you to have diarrhea.  Follow-up instructions: Please follow-up with your primary care provider if symptoms are not improving within the next 5 days.  Return instructions:  Please return to the Emergency Department if you: Develop confusion or become poorly responsive or faint Develop a fever above 100.19F Worsening pain You have persistent vomiting and/or are unable to keep medications down Please return if you have any other emergent concerns.

## 2024-06-16 LAB — URINE CULTURE: Culture: NO GROWTH

## 2024-06-19 ENCOUNTER — Emergency Department (HOSPITAL_COMMUNITY)

## 2024-06-19 ENCOUNTER — Other Ambulatory Visit: Payer: Self-pay

## 2024-06-19 ENCOUNTER — Encounter (HOSPITAL_COMMUNITY): Payer: Self-pay | Admitting: Emergency Medicine

## 2024-06-19 ENCOUNTER — Emergency Department (HOSPITAL_COMMUNITY)
Admission: EM | Admit: 2024-06-19 | Discharge: 2024-06-19 | Disposition: A | Discharge status: Home / Self Care | Attending: Emergency Medicine | Admitting: Emergency Medicine

## 2024-06-19 DIAGNOSIS — D72829 Elevated white blood cell count, unspecified: Secondary | ICD-10-CM | POA: Diagnosis not present

## 2024-06-19 DIAGNOSIS — M545 Low back pain, unspecified: Secondary | ICD-10-CM | POA: Insufficient documentation

## 2024-06-19 DIAGNOSIS — R319 Hematuria, unspecified: Secondary | ICD-10-CM | POA: Diagnosis not present

## 2024-06-19 LAB — URINALYSIS, ROUTINE W REFLEX MICROSCOPIC
Bacteria, UA: NONE SEEN
Bilirubin Urine: NEGATIVE
Glucose, UA: NEGATIVE mg/dL
Ketones, ur: NEGATIVE mg/dL
Leukocytes,Ua: NEGATIVE
Nitrite: NEGATIVE
Protein, ur: NEGATIVE mg/dL
Specific Gravity, Urine: 1.015 (ref 1.005–1.030)
pH: 5 (ref 5.0–8.0)

## 2024-06-19 LAB — CBC
HCT: 46.5 % — ABNORMAL HIGH (ref 36.0–46.0)
Hemoglobin: 15.1 g/dL — ABNORMAL HIGH (ref 12.0–15.0)
MCH: 30.1 pg (ref 26.0–34.0)
MCHC: 32.5 g/dL (ref 30.0–36.0)
MCV: 92.8 fL (ref 80.0–100.0)
Platelets: 211 10*3/uL (ref 150–400)
RBC: 5.01 MIL/uL (ref 3.87–5.11)
RDW: 12.7 % (ref 11.5–15.5)
WBC: 11.6 10*3/uL — ABNORMAL HIGH (ref 4.0–10.5)
nRBC: 0 % (ref 0.0–0.2)

## 2024-06-19 LAB — COMPREHENSIVE METABOLIC PANEL WITH GFR
ALT: 13 U/L (ref 0–44)
AST: 22 U/L (ref 15–41)
Albumin: 4.4 g/dL (ref 3.5–5.0)
Alkaline Phosphatase: 89 U/L (ref 38–126)
Anion gap: 12 (ref 5–15)
BUN: 10 mg/dL (ref 6–20)
CO2: 25 mmol/L (ref 22–32)
Calcium: 9.5 mg/dL (ref 8.9–10.3)
Chloride: 104 mmol/L (ref 98–111)
Creatinine, Ser: 0.79 mg/dL (ref 0.44–1.00)
GFR, Estimated: >60 mL/min
Glucose, Bld: 87 mg/dL (ref 70–99)
Potassium: 4 mmol/L (ref 3.5–5.1)
Sodium: 141 mmol/L (ref 135–145)
Total Bilirubin: 0.4 mg/dL (ref 0.0–1.2)
Total Protein: 7.5 g/dL (ref 6.5–8.1)

## 2024-06-19 LAB — LIPASE, BLOOD: Lipase: 18 U/L (ref 11–51)

## 2024-06-19 MED ORDER — LIDOCAINE 5 % EX PTCH: 1.0000 | MEDICATED_PATCH | CUTANEOUS | 0 refills | Status: DC

## 2024-06-19 MED ORDER — LIDOCAINE 5 % EX PTCH
1.0000 | MEDICATED_PATCH | CUTANEOUS | Status: DC
  Administered 2024-06-19: 1 via TRANSDERMAL
  Filled 2024-06-19: qty 1

## 2024-06-19 MED ORDER — METHOCARBAMOL 500 MG PO TABS
500.0000 mg | ORAL_TABLET | Freq: Once | ORAL | Status: AC
  Administered 2024-06-19: 500 mg via ORAL
  Filled 2024-06-19: qty 1

## 2024-06-19 MED ORDER — METHOCARBAMOL 500 MG PO TABS: 500.0000 mg | ORAL_TABLET | Freq: Two times a day (BID) | ORAL | 0 refills | Status: DC

## 2024-06-19 NOTE — ED Provider Notes (Signed)
 " [place] EMERGENCY DEPARTMENT AT [facility] Provider Note   CSN: [id number] Arrival date & time: 06/19/24  1207     Patient presents with: Back Pain and Diarrhea   Charlotte Green is a 59 y.o. female.  59 year old female presents emergency department with complaints of left lower back pain radiating to her groin and down her left leg.  Patient also reports some dysuria.  She was seen on 22 January at urgent care and was placed on Keflex  at that time for concerns of UTI.  Patient has had a few episodes of diarrhea since the 20th and a single episode of incontinence when the diarrhea first started and she was in bed.  No urinary or bowel incontinence since.  Patient denies any traumatic events.  Patient denies any fever, patient reports some relief of pain with Tylenol .      Prior to Admission medications  Medication Sig Start Date End Date Taking? Authorizing Provider  cephALEXin  (KEFLEX ) 500 MG capsule Take 1 capsule (500 mg total) by mouth 2 (two) times daily for 10 days. 06/16/24 06/26/24 Yes Veta Palma, PA-C  lidocaine  (LIDODERM ) 5 % Place 1 patch onto the skin daily. Remove & Discard patch within 12 hours or as directed by MD 06/19/24  Yes Myriam Fonda RAMAN, PA-C  methocarbamol  (ROBAXIN ) 500 MG tablet Take 1 tablet (500 mg total) by mouth 2 (two) times daily. 06/19/24  Yes Myriam Fonda RAMAN, PA-C  albuterol  (VENTOLIN  HFA) 108 (90 Base) MCG/ACT inhaler Inhale 1-2 puffs into the lungs every 6 (six) hours as needed for wheezing or shortness of breath. Patient not taking: Reported on 06/19/2024 07/10/21   Arlyss Leita BRAVO, PA-C    Allergies: Tylenol  [acetaminophen ]    Review of Systems  Gastrointestinal:  Positive for diarrhea.  Genitourinary:  Positive for dysuria.  Musculoskeletal:  Positive for back pain.  All other systems reviewed and are negative.   Updated Vital Signs BP 124/65 (BP Location: Right Arm)   Pulse 90   Temp 98.5 F (36.9 C) (Oral)   Resp 16    LMP 02/06/2012   SpO2 100%   Physical Exam Vitals and nursing note reviewed.  Constitutional:      General: She is not in acute distress.    Appearance: Normal appearance. She is not ill-appearing or toxic-appearing.  HENT:     Head: Normocephalic and atraumatic.     Nose: Nose normal.  Eyes:     Extraocular Movements: Extraocular movements intact.     Conjunctiva/sclera: Conjunctivae normal.     Pupils: Pupils are equal, round, and reactive to light.  Cardiovascular:     Rate and Rhythm: Normal rate and regular rhythm.  Pulmonary:     Effort: Pulmonary effort is normal. No respiratory distress.     Breath sounds: Normal breath sounds.  Abdominal:     General: Abdomen is flat. There is no distension.     Tenderness: There is no abdominal tenderness. There is no right CVA tenderness, left CVA tenderness or guarding.  Musculoskeletal:        General: Tenderness present. No swelling. Normal range of motion.     Cervical back: Normal range of motion.     Comments: Pain to palpation of the left lower back no lumbar spine tenderness.    Skin:    General: Skin is warm.     Capillary Refill: Capillary refill takes less than 2 seconds.  Neurological:     General: No focal deficit present.  Mental Status: She is alert.  Psychiatric:        Mood and Affect: Mood normal.        Behavior: Behavior normal.     (all labs ordered are listed, but only abnormal results are displayed) Labs Reviewed  CBC - Abnormal; Notable for the following components:      Result Value   WBC 11.6 (*)    Hemoglobin 15.1 (*)    HCT 46.5 (*)    All other components within normal limits  URINALYSIS, ROUTINE W REFLEX MICROSCOPIC - Abnormal; Notable for the following components:   Hgb urine dipstick MODERATE (*)    All other components within normal limits  LIPASE, BLOOD  COMPREHENSIVE METABOLIC PANEL WITH GFR    EKG: None  Radiology: CT Renal Stone Study Result Date: 06/19/2024 CLINICAL DATA:   Left lower back pain radiating into the abdomen, groin, and left leg EXAM: CT ABDOMEN AND PELVIS WITHOUT CONTRAST TECHNIQUE: Multidetector CT imaging of the abdomen and pelvis was performed following the standard protocol without IV contrast. RADIATION DOSE REDUCTION: This exam was performed according to the departmental dose-optimization program which includes automated exposure control, adjustment of the mA and/or kV according to patient size and/or use of iterative reconstruction technique. COMPARISON:  None Available. FINDINGS: Lower chest: No focal consolidation or pulmonary nodule in the lung bases. No pleural effusion or pneumothorax demonstrated. Partially imaged heart size is normal. Hepatobiliary: 1.2 cm hypodensity in segment 2 (2:11). No intra or extrahepatic biliary ductal dilation. Gallbladder is contracted. Pancreas: No focal lesions or main ductal dilation. Spleen: Normal in size without focal abnormality. Adrenals/Urinary Tract: No adrenal nodules. Under rotated left kidney. No hydronephrosis or calculi. Ill-defined patchy hypodensity involving the interpolar and lower pole left kidney. No focal bladder wall thickening. Stomach/Bowel: Normal appearance of the stomach. Small gas-filled diverticulum at the duodenojejunal junction. No evidence of bowel wall thickening, distention, or inflammatory changes. Colonic diverticulosis without acute diverticulitis. Small, rounded foci of gas abutting the anterior wall of the mid to distal transverse colon, likely gas within diverticula. Normal appendix. Vascular/Lymphatic: Aortic atherosclerosis. No enlarged abdominal or pelvic lymph nodes. Reproductive: No adnexal masses. Other: No free fluid, fluid collection, or free air. Musculoskeletal: No acute or abnormal lytic or blastic osseous lesions. Small fat-containing right inguinal hernia. IMPRESSION: 1. Ill-defined patchy hypodensity involving the interpolar and lower pole left kidney, which may represent  pyelonephritis or underlying renal cysts. Recommend correlation with urinalysis and consider further evaluation with nonemergent renal protocol MRI abdomen or CT. 2. Colonic diverticulosis without acute diverticulitis. 3. Small fat-containing right inguinal hernia. 4.  Aortic Atherosclerosis (ICD10-I70.0). Electronically Signed   By: Limin  Xu M.D.   On: 06/19/2024 13:27   CT L-SPINE NO CHARGE Result Date: 06/19/2024 EXAM: CT OF THE LUMBAR SPINE WITHOUT CONTRAST 06/19/2024 01:10:14 PM TECHNIQUE: CT of the lumbar spine was performed without the administration of intravenous contrast. Multiplanar reformatted images are provided for review. Automated exposure control, iterative reconstruction, and/or weight based adjustment of the mA/kV was utilized to reduce the radiation dose to as low as reasonably achievable. COMPARISON: None available. CLINICAL HISTORY: Low back pain. FINDINGS: BONES AND ALIGNMENT: Mild curvature of the lumbar spine is convex towards the left. The vertebral body heights are well preserved. Facet joints are all aligned. No signs of acute fracture or subluxation. No suspicious bone lesion. DEGENERATIVE CHANGES: Mild multilevel posterior disc bulge is identified at L2-L3, L3-L4, L4-L5, and L5-S1. SOFT TISSUES: No acute abnormality. IMPRESSION: 1. No acute findings.  2. Mild multilevel posterior disc bulge at L2-3, L3-4, L4-5, and L5-S1. 3. Mild curvature of the lumbar spine as convex towards the left. Electronically signed by: Waddell Calk MD 06/19/2024 01:24 PM EST RP Workstation: [id number]     Procedures   Medications Ordered in the ED  lidocaine  (LIDODERM ) 5 % 1 patch (1 patch Transdermal Patch Applied 06/19/24 1538)  methocarbamol  (ROBAXIN ) tablet 500 mg (500 mg Oral Given 06/19/24 1641)    59 y.o. female presents to the ED with complaints of left lower back pain and dysuria with associated pain radiating to her groin, The differential diagnosis includes UTI, kidney stone,  pyelonephritis, muscular strain/spasm, sciatica (Ddx)  On arrival pt is nontoxic, vitals unremarkable. Exam significant for left lower back pain worse with movement and relief of pain with palpation.  I ordered medication lidocaine  patch and Robaxin  for muscular pain  Lab Tests:  CMP CBC UA lipase ordered in triage. Notable for mild leukocytosis at 11.6 and moderate blood noted in urine.  Imaging Studies ordered:  I ordered imaging studies which included CT renal and CT lumbar spine.  Ordered in triage.  Notable for ill defined patchy hypodensity involving the interpolar and lower pole left kidney concerning for pyelonephritis or renal cyst.  Diverticulosis with mild diverticulitis small inguinal hernia and aortic arthrosclerosis.  Lumbar CT notable for mild multilevel posterior disc bulge at L2-3-4 5 and mild curvature.  ED Course:   Patient is sitting comfortably in ED bed in no acute distress nontoxic-appearing.  CT results were discussed with patient at bedside.  Patient was started on Keflex  on the 22nd from urgent care.  With palpation of left lower back patient reports relief of pain.  Patient will be started on lidocaine  patch and Robaxin  for symptom management.  Cauda equina was considered but patient has no current bowel incontinence and no numbness or saddle paresthesia.  Patient has no pain to lumbar spine on palpation.  No abdominal tenderness noted.  Patient has also been taking Tylenol  with some relief of symptoms. Lower back pain is possibly coming from UTI versus muscular strain/spasm.  Patient is ambulatory and has negative straight leg raise.  Incidental findings were discussed with patient at bedside.  Patient was advised to continue taking antibiotics and was given muscle relaxers and lidocaine  patches for symptom relief.  Patient agreed of return precaution and to follow-up with PCP for further evaluation.   Portions of this note were generated with Scientist, clinical (histocompatibility and immunogenetics).  Dictation errors may occur despite best attempts at proofreading.   Final diagnoses:  Acute left-sided low back pain without sciatica    ED Discharge Orders          Ordered    lidocaine  (LIDODERM ) 5 %  Every 24 hours        06/19/24 1722    methocarbamol  (ROBAXIN ) 500 MG tablet  2 times daily        06/19/24 1722               Myriam Fonda GORMAN DEVONNA 06/19/24 1741    Armenta Canning, MD 06/24/24 1510  "

## 2024-06-19 NOTE — Discharge Instructions (Addendum)
 Imaging and lab work were overall reassuring.  Please continue to take the antibiotic given by urgent care.  I have given you a muscle relaxer and lidocaine  patches to use for back pain.  It is suggested that you follow-up with a primary care for further assessment and discussion of incidental findings on imaging today.  If you experience any worsening symptoms please return to the emergency department for further evaluation.  Examples of symptoms to monitor for would be numbness and tingling, uncontrollable bowels or urination, or worsening pain.

## 2024-06-19 NOTE — ED Triage Notes (Signed)
 Patient presents due to what she reports as severe back pain the radiates into the abdomin, groin and down the left leg. She was seen at an urgent care and the hospital. The symptoms were thought to be caused by a UTI, so she was given an antibiotic. Pain started a few days before 06/15/2024. No injury reported. She also reports diarrhea which began about 06/13/24.

## 2024-06-19 NOTE — ED Provider Triage Note (Signed)
 Emergency Medicine Provider Triage Evaluation Note  Charlotte Green , a 59 y.o. female  was evaluated in triage.  Pt complains of patient is having left lower back pain.  Patient reports left lower back pain and with some associated pain in her left lateral patient had 1 episode of diarrhea incontinence on the 22nd.  Patient denies any saddle paresthesias, or any other episodes of incontinence or falls.  Patient does endorse left lower back pain and pain down her left leg.  Patient also reports some burning sensation with sedation.  Review of Systems  Positive: Back pain, diarrhea Negative: Shortness of breath, chest pain, saddle paresthesias,  Physical Exam  BP 124/65 (BP Location: Right Arm)   Pulse 90   Temp 98.5 F (36.9 C) (Oral)   Resp 16   LMP 02/06/2012   SpO2 100%  Gen:   Awake, no distress   Resp:  Normal effort  MSK:   Moves extremities without difficulty  Other:    Medical Decision Making  Medically screening exam initiated at 12:50 PM.  Appropriate orders placed.  Charlotte Green was informed that the remainder of the evaluation will be completed by another provider, this initial triage assessment does not replace that evaluation, and the importance of remaining in the ED until their evaluation is complete.     Charlotte Green, [place] 06/19/24 1252

## 2024-06-25 ENCOUNTER — Encounter (HOSPITAL_COMMUNITY): Payer: Self-pay | Admitting: *Deleted

## 2024-06-25 ENCOUNTER — Emergency Department (HOSPITAL_COMMUNITY)
Admission: EM | Admit: 2024-06-25 | Discharge: 2024-06-25 | Disposition: A | Discharge status: Home / Self Care | Attending: Emergency Medicine | Admitting: Emergency Medicine

## 2024-06-25 ENCOUNTER — Emergency Department (HOSPITAL_COMMUNITY)

## 2024-06-25 ENCOUNTER — Other Ambulatory Visit: Payer: Self-pay

## 2024-06-25 DIAGNOSIS — M5416 Radiculopathy, lumbar region: Secondary | ICD-10-CM | POA: Insufficient documentation

## 2024-06-25 DIAGNOSIS — M545 Low back pain, unspecified: Secondary | ICD-10-CM | POA: Diagnosis present

## 2024-06-25 LAB — COMPREHENSIVE METABOLIC PANEL WITH GFR
ALT: 8 U/L (ref 0–44)
AST: 16 U/L (ref 15–41)
Albumin: 4.4 g/dL (ref 3.5–5.0)
Alkaline Phosphatase: 71 U/L (ref 38–126)
Anion gap: 12 (ref 5–15)
BUN: 17 mg/dL (ref 6–20)
CO2: 24 mmol/L (ref 22–32)
Calcium: 9.4 mg/dL (ref 8.9–10.3)
Chloride: 102 mmol/L (ref 98–111)
Creatinine, Ser: 0.83 mg/dL (ref 0.44–1.00)
GFR, Estimated: >60 mL/min
Glucose, Bld: 104 mg/dL — ABNORMAL HIGH (ref 70–99)
Potassium: 3.9 mmol/L (ref 3.5–5.1)
Sodium: 137 mmol/L (ref 135–145)
Total Bilirubin: 0.9 mg/dL (ref 0.0–1.2)
Total Protein: 7 g/dL (ref 6.5–8.1)

## 2024-06-25 LAB — URINALYSIS, W/ REFLEX TO CULTURE (INFECTION SUSPECTED)
Bacteria, UA: NONE SEEN
Bilirubin Urine: NEGATIVE
Glucose, UA: NEGATIVE mg/dL
Ketones, ur: NEGATIVE mg/dL
Nitrite: NEGATIVE
Protein, ur: NEGATIVE mg/dL
Specific Gravity, Urine: 1.02 (ref 1.005–1.030)
pH: 5 (ref 5.0–8.0)

## 2024-06-25 LAB — CBC WITH DIFFERENTIAL/PLATELET
Abs Immature Granulocytes: 0.05 10*3/uL (ref 0.00–0.07)
Basophils Absolute: 0.1 10*3/uL (ref 0.0–0.1)
Basophils Relative: 1 %
Eosinophils Absolute: 0.2 10*3/uL (ref 0.0–0.5)
Eosinophils Relative: 2 %
HCT: 44.9 % (ref 36.0–46.0)
Hemoglobin: 15 g/dL (ref 12.0–15.0)
Immature Granulocytes: 0 %
Lymphocytes Relative: 27 %
Lymphs Abs: 3.4 10*3/uL (ref 0.7–4.0)
MCH: 30.5 pg (ref 26.0–34.0)
MCHC: 33.4 g/dL (ref 30.0–36.0)
MCV: 91.4 fL (ref 80.0–100.0)
Monocytes Absolute: 0.6 10*3/uL (ref 0.1–1.0)
Monocytes Relative: 5 %
Neutro Abs: 8.2 10*3/uL — ABNORMAL HIGH (ref 1.7–7.7)
Neutrophils Relative %: 65 %
Platelets: 214 10*3/uL (ref 150–400)
RBC: 4.91 MIL/uL (ref 3.87–5.11)
RDW: 12.4 % (ref 11.5–15.5)
WBC: 12.6 10*3/uL — ABNORMAL HIGH (ref 4.0–10.5)
nRBC: 0 % (ref 0.0–0.2)

## 2024-06-25 LAB — CK: Total CK: 56 U/L (ref 38–234)

## 2024-06-25 MED ORDER — KETOROLAC TROMETHAMINE 30 MG/ML IJ SOLN
30.0000 mg | Freq: Once | INTRAMUSCULAR | Status: AC
  Administered 2024-06-25: 30 mg via INTRAVENOUS
  Filled 2024-06-25: qty 1

## 2024-06-25 MED ORDER — CYCLOBENZAPRINE HCL 10 MG PO TABS: 5.0000 mg | ORAL_TABLET | Freq: Two times a day (BID) | ORAL | 0 refills | Status: AC | PRN

## 2024-06-25 MED ORDER — GADOBUTROL 1 MMOL/ML IV SOLN
7.0000 mL | Freq: Once | INTRAVENOUS | Status: AC | PRN
  Administered 2024-06-25: 7 mL via INTRAVENOUS

## 2024-06-25 MED ORDER — LIDOCAINE 5 % EX PTCH: 1.0000 | MEDICATED_PATCH | CUTANEOUS | 0 refills | Status: AC

## 2024-06-25 MED ORDER — LIDOCAINE 5 % EX PTCH
1.0000 | MEDICATED_PATCH | CUTANEOUS | Status: DC
  Administered 2024-06-25: 1 via TRANSDERMAL
  Filled 2024-06-25: qty 1

## 2024-06-25 NOTE — ED Provider Notes (Signed)
 duplicate   Fredia Rosette Kirsch, MD 06/25/24 2236

## 2024-06-25 NOTE — ED Provider Triage Note (Signed)
 Emergency Medicine Provider Triage Evaluation Note  Charlotte Green , a 59 y.o. female  was evaluated in triage.  Pt complains of dysuria, abdominal pain, flank pain. Reports sx onset x 2 weeks ago. Was diagnosed with UTI and given antibiotics which she reports she only has a few days left and denies any improvement of symptoms. Denies fevers, reports nausea without vomiting. Pain in LLQ and left flank  Review of Systems  Positive:  Negative:   Physical Exam  BP (!) 154/105 (BP Location: Right Arm)   Pulse 89   Temp 98.2 F (36.8 C) (Oral)   Resp 16   Ht 5' 2 (1.575 m)   Wt 69 kg   LMP 02/06/2012   SpO2 100%   BMI 27.82 kg/m  Gen:   Awake, no distress   Resp:  Normal effort  MSK:   Moves extremities without difficulty  Other:  Well appearing, walking around the room  Medical Decision Making  Medically screening exam initiated at 4:59 PM.  Appropriate orders placed.  SHAUNESSY DOBRATZ was informed that the remainder of the evaluation will be completed by another provider, this initial triage assessment does not replace that evaluation, and the importance of remaining in the ED until their evaluation is complete.     Nora Lauraine LABOR, PA-C 06/25/24 1701

## 2024-06-25 NOTE — ED Notes (Signed)
 Pt discharged per provider order. All discharge instructions were discussed with pt and understanding verbalized. At time of DC pt is alert, ambulatory, and able to make needs known. Pt voices no concerns at this time. Pt took all personal belongings home.

## 2024-06-25 NOTE — Discharge Instructions (Addendum)
 You were seen in the ER for left-sided back pain.  Your MRI shows that you have a pinched nerve in this area.  Avoid activities that cause pain.  You should use ibuprofen  600 mg every 8 hours as needed for pain.  I have prescribed Lidoderm  patches.  I have also prescribed Flexeril  which is a muscle relaxant.  This medication can make you drowsy so do not drive or operate heavy equipment while taking it.   Take medications as prescribed and avoid activities that worsen your pain.  Bed rest can worsen symptoms so try to stay active without over-exerting yourself.  When your pain improves, start stretches and exercises below.  You can also use ice or heat for 10 minutes every few hours.  You can use over the counter muscle rubs and massage to help as well.  If you still have pain in 1-2 weeks, see your doctor for re-evaluation.    I am referring you to orthopedics  If you develop fevers, weakness, numbness, or changes in your bowel or bladder function please be evaluated immediately in the ER.    Thank you for allowing me to take care of you today and I hope you feel better soon!

## 2024-06-25 NOTE — ED Provider Notes (Signed)
 " [facility] Provider Note   CSN: [id number] Arrival date & time: 06/16/24  0836    HPI 59 yo female with no significant PMHx presents with left lower back pain radiating around to left lower abdomen since 1/20. Seen here 1/22 and had UA positive, treated for UTI with 10 days keflex . Seen again 1/26 and treated with robaxin  and lidoderm , as well as continue tylenol  but ongoing pain.  No change in bowel or bladder function.  No numbness or weakness.  No fevers, chills, malaise, sweats.  Ucx 1/22 negative  CT Renal protocol 1/26 IMPRESSION: 1. Ill-defined patchy hypodensity involving the interpolar and lower pole left kidney, which may represent pyelonephritis or underlying renal cysts. Recommend correlation with urinalysis and consider further evaluation with nonemergent renal protocol MRI abdomen or CT. 2. Colonic diverticulosis without acute diverticulitis. 3. Small fat-containing right inguinal hernia. 4.  Aortic Atherosclerosis (ICD10-I70.0).  Current Outpatient Medications  Medication Instructions   albuterol  (VENTOLIN  HFA) 108 (90 Base) MCG/ACT inhaler 1-2 puffs, Inhalation, Every 6 hours PRN   cephALEXin  (KEFLEX ) 500 mg, Oral, 2 times daily   lidocaine  (LIDODERM ) 5 % 1 patch, Transdermal, Every 24 hours, Remove & Discard patch within 12 hours or as directed by MD   methocarbamol  (ROBAXIN ) 500 mg, Oral, 2 times daily     Allergies[1]   Review of Systems  Left-sided back pain  Updated Vital Signs BP (!) 139/101   Pulse 97   Temp 99.3 F (37.4 C) (Oral)   Resp 18   LMP 06/11/2024   SpO2 99%   Physical Exam  Constitutional: Patient appears well-developed and well-nourished. No distress.  HENT:  Head: Normocephalic and atraumatic.  Mouth/Throat: Oropharynx is clear and moist. No oropharyngeal exudate.  Eyes: Conjunctivae are normal. Pupils are equal, round, and reactive to light. Neck: Normal range of motion. Neck  supple.  Cardiovascular: Normal rate, regular rhythm, normal heart sounds and intact distal pulses.   Pulmonary/Chest: Effort normal and breath sounds normal. No respiratory distress. No wheezes or rales.  Abdominal: Soft. Bowel sounds are normal. No distension or tenderness.  Musculoskeletal: Normal range of motion.  Minimal left lumbar paraspinal tenderness.  Neurological: Patient is alert and oriented.  No facial droop.  Clear speech.  Normal strength and sensation throughout.  Normal coordination. Skin: Skin is warm and dry. No diaphoresis.  Psychiatric: Normal mood and affect. Normal behavior. Judgment and thought content normal.  Nursing note and vitals reviewed.    Results for orders placed or performed during the hospital encounter of 06/25/24  CBC with Differential   Collection Time: 06/25/24  5:05 PM  Result Value Ref Range   WBC 12.6 (H) 4.0 - 10.5 K/uL   RBC 4.91 3.87 - 5.11 MIL/uL   Hemoglobin 15.0 12.0 - 15.0 g/dL   HCT 55.0 63.9 - 53.9 %   MCV 91.4 80.0 - 100.0 fL   MCH 30.5 26.0 - 34.0 pg   MCHC 33.4 30.0 - 36.0 g/dL   RDW 87.5 88.4 - 84.4 %   Platelets 214 150 - 400 K/uL   nRBC 0.0 0.0 - 0.2 %   Neutrophils Relative % 65 %   Neutro Abs 8.2 (H) 1.7 - 7.7 K/uL   Lymphocytes Relative 27 %   Lymphs Abs 3.4 0.7 - 4.0 K/uL   Monocytes Relative 5 %   Monocytes Absolute 0.6 0.1 - 1.0 K/uL   Eosinophils Relative 2 %   Eosinophils Absolute 0.2 0.0 - 0.5  K/uL   Basophils Relative 1 %   Basophils Absolute 0.1 0.0 - 0.1 K/uL   Immature Granulocytes 0 %   Abs Immature Granulocytes 0.05 0.00 - 0.07 K/uL  Comprehensive metabolic panel   Collection Time: 06/25/24  5:05 PM  Result Value Ref Range   Sodium 137 135 - 145 mmol/L   Potassium 3.9 3.5 - 5.1 mmol/L   Chloride 102 98 - 111 mmol/L   CO2 24 22 - 32 mmol/L   Glucose, Bld 104 (H) 70 - 99 mg/dL   BUN 17 6 - 20 mg/dL   Creatinine, Ser 9.16 0.44 - 1.00 mg/dL   Calcium 9.4 8.9 - 89.6 mg/dL   Total Protein 7.0 6.5 - 8.1  g/dL   Albumin 4.4 3.5 - 5.0 g/dL   AST 16 15 - 41 U/L   ALT 8 0 - 44 U/L   Alkaline Phosphatase 71 38 - 126 U/L   Total Bilirubin 0.9 0.0 - 1.2 mg/dL   GFR, Estimated >39 >39 mL/min   Anion gap 12 5 - 15  CK   Collection Time: 06/25/24  5:05 PM  Result Value Ref Range   Total CK 56 38 - 234 U/L  Urinalysis, w/ Reflex to Culture (Infection Suspected) -Urine, Clean Catch   Collection Time: 06/25/24  5:15 PM  Result Value Ref Range   Specimen Source URINE, CLEAN CATCH    Color, Urine YELLOW YELLOW   APPearance CLEAR CLEAR   Specific Gravity, Urine 1.020 1.005 - 1.030   pH 5.0 5.0 - 8.0   Glucose, UA NEGATIVE NEGATIVE mg/dL   Hgb urine dipstick MODERATE (A) NEGATIVE   Bilirubin Urine NEGATIVE NEGATIVE   Ketones, ur NEGATIVE NEGATIVE mg/dL   Protein, ur NEGATIVE NEGATIVE mg/dL   Nitrite NEGATIVE NEGATIVE   Leukocytes,Ua TRACE (A) NEGATIVE   RBC / HPF 0-5 0 - 5 RBC/hpf   WBC, UA 0-5 0 - 5 WBC/hpf   Bacteria, UA NONE SEEN NONE SEEN   Squamous Epithelial / HPF 0-5 0 - 5 /HPF   MR Lumbar Spine W Wo Contrast Result Date: 06/25/2024 CLINICAL DATA:  Initial evaluation for acute low back pain, cauda equina syndrome suspected. EXAM: MRI LUMBAR SPINE WITHOUT AND WITH CONTRAST TECHNIQUE: Multiplanar and multiecho pulse sequences of the lumbar spine were obtained without and with intravenous contrast. CONTRAST:  7mL GADAVIST  GADOBUTROL  1 MMOL/ML IV SOLN COMPARISON:  Comparison made with CT from 06/19/2024. FINDINGS: Segmentation: Standard. Lowest well-formed disc space labeled the L5-S1 level. Alignment: Sigmoid scoliosis with predominant right convex component. Trace degenerative retrolisthesis of L1 on L2. Alignment otherwise normal preservation of the normal lumbar lordosis. Vertebrae: Vertebral body height well maintained without acute or chronic fracture. Bone marrow signal intensity within normal limits. No discrete or worrisome osseous lesions. Mild degenerative reactive endplate change with  marrow edema present at the superior endplate of L2. No other significant abnormal marrow edema or enhancement. Conus medullaris and cauda equina: Conus extends to the L1 level. Conus and cauda equina appear normal. Paraspinal and other soft tissues: Paraspinous soft tissues within normal limits. Few scattered T2 hyperintense cyst noted about the visualized kidneys, benign in appearance, no follow-up imaging recommended. Disc levels: T11-12: Seen only on sagittal projection. Disc desiccation with diffuse disc bulge and probable small central disc protrusion. Bilateral facet hypertrophy. No significant stenosis. T12-L1: Normal interspace.  No canal or foraminal stenosis. L1-2: Disc desiccation with diffuse disc bulge. Minimal facet spurring. No spinal stenosis. Foramina remain patent. L2-3: Disc desiccation with  mild disc bulge. Superimposed left foraminal disc protrusion contacts the exiting left L2 nerve root (series 9, image 15). Mild bilateral facet hypertrophy. No significant spinal stenosis. Mild left L2 foraminal narrowing. Right neural foramina remains patent. L3-4: Disc desiccation with mild disc bulge. Mild reactive endplate spurring. Mild bilateral facet hypertrophy with trace joint effusions. No significant spinal stenosis. Mild right L3 foraminal narrowing. Left neural foramen remains patent. L4-5: Disc desiccation with mild disc bulge. Mild endplate spurring on the right. Mild to moderate bilateral facet hypertrophy. No spinal stenosis. Mild left L4 foraminal narrowing. Right neural foramen remains patent. L5-S1: Normal interspace. Mild to moderate bilateral facet hypertrophy. No canal or foraminal stenosis. IMPRESSION: 1. No acute abnormality within the lumbar spine. No evidence for cord compression or high-grade spinal stenosis. 2. Left foraminal disc protrusion at L2-3, contacting and potentially affecting the exiting left L2 nerve root. 3. Additional mild noncompressive disc bulging and facet  hypertrophy at L1-2 through L4-5 without significant stenosis or overt neural impingement. Electronically Signed   By: Morene Hoard M.D.   On: 06/25/2024 20:44   CT Renal Stone Study Result Date: 06/19/2024 CLINICAL DATA:  Left lower back pain radiating into the abdomen, groin, and left leg EXAM: CT ABDOMEN AND PELVIS WITHOUT CONTRAST TECHNIQUE: Multidetector CT imaging of the abdomen and pelvis was performed following the standard protocol without IV contrast. RADIATION DOSE REDUCTION: This exam was performed according to the departmental dose-optimization program which includes automated exposure control, adjustment of the mA and/or kV according to patient size and/or use of iterative reconstruction technique. COMPARISON:  None Available. FINDINGS: Lower chest: No focal consolidation or pulmonary nodule in the lung bases. No pleural effusion or pneumothorax demonstrated. Partially imaged heart size is normal. Hepatobiliary: 1.2 cm hypodensity in segment 2 (2:11). No intra or extrahepatic biliary ductal dilation. Gallbladder is contracted. Pancreas: No focal lesions or main ductal dilation. Spleen: Normal in size without focal abnormality. Adrenals/Urinary Tract: No adrenal nodules. Under rotated left kidney. No hydronephrosis or calculi. Ill-defined patchy hypodensity involving the interpolar and lower pole left kidney. No focal bladder wall thickening. Stomach/Bowel: Normal appearance of the stomach. Small gas-filled diverticulum at the duodenojejunal junction. No evidence of bowel wall thickening, distention, or inflammatory changes. Colonic diverticulosis without acute diverticulitis. Small, rounded foci of gas abutting the anterior wall of the mid to distal transverse colon, likely gas within diverticula. Normal appendix. Vascular/Lymphatic: Aortic atherosclerosis. No enlarged abdominal or pelvic lymph nodes. Reproductive: No adnexal masses. Other: No free fluid, fluid collection, or free air.  Musculoskeletal: No acute or abnormal lytic or blastic osseous lesions. Small fat-containing right inguinal hernia. IMPRESSION: 1. Ill-defined patchy hypodensity involving the interpolar and lower pole left kidney, which may represent pyelonephritis or underlying renal cysts. Recommend correlation with urinalysis and consider further evaluation with nonemergent renal protocol MRI abdomen or CT. 2. Colonic diverticulosis without acute diverticulitis. 3. Small fat-containing right inguinal hernia. 4.  Aortic Atherosclerosis (ICD10-I70.0). Electronically Signed   By: Limin  Xu M.D.   On: 06/19/2024 13:27   CT L-SPINE NO CHARGE Result Date: 06/19/2024 EXAM: CT OF THE LUMBAR SPINE WITHOUT CONTRAST 06/19/2024 01:10:14 PM TECHNIQUE: CT of the lumbar spine was performed without the administration of intravenous contrast. Multiplanar reformatted images are provided for review. Automated exposure control, iterative reconstruction, and/or weight based adjustment of the mA/kV was utilized to reduce the radiation dose to as low as reasonably achievable. COMPARISON: None available. CLINICAL HISTORY: Low back pain. FINDINGS: BONES AND ALIGNMENT: Mild curvature of the lumbar  spine is convex towards the left. The vertebral body heights are well preserved. Facet joints are all aligned. No signs of acute fracture or subluxation. No suspicious bone lesion. DEGENERATIVE CHANGES: Mild multilevel posterior disc bulge is identified at L2-L3, L3-L4, L4-L5, and L5-S1. SOFT TISSUES: No acute abnormality. IMPRESSION: 1. No acute findings. 2. Mild multilevel posterior disc bulge at L2-3, L3-4, L4-5, and L5-S1. 3. Mild curvature of the lumbar spine as convex towards the left. Electronically signed by: Waddell Calk MD 06/19/2024 01:24 PM EST RP Workstation: [id number]      Medications Ordered in the ED  ketorolac  (TORADOL ) 30 MG/ML injection 30 mg (has no administration in time range)  lidocaine  (LIDODERM ) 5 % 1 patch (has no  administration in time range)  gadobutrol  (GADAVIST ) 1 MMOL/ML injection 7 mL (7 mLs Intravenous Contrast Given 06/25/24 2020)                                     This patient presents to the ED with chief complaint(s) of left lower back pain with pertinent past medical history of no significant history. The complaint involves an extensive differential diagnosis and also carries with it a high risk of complications and morbidity.    Additional history obtained from husband. I have also reviewed previous ED records  The differential diagnosis includes lumbar radiculopathy, lumbar sprains/strain, occult fracture, UTI/pyelo-  The initial management included labs, CT, MRI    Reassessments:  The following labs were independently interpreted: Leukocytosis with white count 12.6.  Urine shows moderate hemoglobin and trace leukocyte esterase but no bacteria or cells.  CK is 56 so do not suspect rhabdomyolysis.  I independently visualized the following imaging with scope of interpretation limited to determining acute life threatening conditions related to emergency care: CT and MRI, which revealed L2 nerve root impingement  Treatment and Reassessment: Given Toradol  and Lidoderm .  Consultation: - Consulted or discussed management/test interpretation with external professional: None  Consideration for admission or further workup: Not indicated  Social Determinants of health: None  59 year old female with multiple ED visits for left-sided back pain.  No evidence of UTI.  No kidney stone.  Symptoms are consistent with lumbar radiculopathy and MRI confirms that.  No evidence of cauda equina or infection.  Will treat in the usual manner with activity modification, pain management, exercises, follow-up with orthopedics.All results reviewed with patient.  All questions answered.  Careful return precautions have been given.  Close follow-up advised. Patient is happy with plan and requests discharge.      ICD-10-CM   1. Lumbar radiculopathy  M54.16        ED Discharge Orders     None           [1]  Allergies Allergen Reactions   Tylenol  [Acetaminophen ] Shortness Of Breath and Swelling     Fredia Rosette Kirsch, MD 06/25/24 2127  "

## 2024-06-25 NOTE — ED Triage Notes (Signed)
 Pt here POV with ongoing issues with back pain, difficulty urinating and shob.  Pt talking in complete sentences. Respirations even and unlabored. Skin warm and dry.

## 2024-08-18 ENCOUNTER — Ambulatory Visit
